# Patient Record
Sex: Female | Born: 1945 | Race: White | Hispanic: No | Marital: Single | Smoking: Never smoker
Health system: Southern US, Community
[De-identification: ages and names within clinical notes are randomized; demographics above are authoritative.]

## PROBLEM LIST (undated history)

## (undated) DIAGNOSIS — M797 Fibromyalgia: Secondary | ICD-10-CM

## (undated) DIAGNOSIS — R011 Cardiac murmur, unspecified: Secondary | ICD-10-CM

## (undated) DIAGNOSIS — E785 Hyperlipidemia, unspecified: Secondary | ICD-10-CM

## (undated) DIAGNOSIS — M1990 Unspecified osteoarthritis, unspecified site: Secondary | ICD-10-CM

## (undated) DIAGNOSIS — J40 Bronchitis, not specified as acute or chronic: Secondary | ICD-10-CM

## (undated) DIAGNOSIS — I10 Essential (primary) hypertension: Secondary | ICD-10-CM

## (undated) HISTORY — PX: JOINT REPLACEMENT: SHX530

## (undated) HISTORY — PX: APPENDECTOMY: SHX54

## (undated) HISTORY — DX: Fibromyalgia: M79.7

## (undated) HISTORY — DX: Hyperlipidemia, unspecified: E78.5

## (undated) HISTORY — DX: Essential (primary) hypertension: I10

## (undated) HISTORY — PX: REPLACEMENT TOTAL KNEE BILATERAL: SUR1225

## (undated) HISTORY — PX: BACK SURGERY: SHX140

---

## 1997-08-26 HISTORY — PX: CHOLECYSTECTOMY: SHX55

## 2002-08-26 HISTORY — PX: LUMBAR FUSION: SHX111

## 2003-08-27 HISTORY — PX: X-STOP IMPLANTATION: SHX2677

## 2005-08-26 HISTORY — PX: TOTAL HIP ARTHROPLASTY: SHX124

## 2010-10-17 ENCOUNTER — Other Ambulatory Visit (HOSPITAL_COMMUNITY): Payer: Self-pay | Admitting: Orthopedic Surgery

## 2010-10-17 DIAGNOSIS — M25552 Pain in left hip: Secondary | ICD-10-CM

## 2010-10-25 ENCOUNTER — Ambulatory Visit (HOSPITAL_COMMUNITY)
Admission: RE | Admit: 2010-10-25 | Discharge: 2010-10-25 | Disposition: A | Discharge status: Home / Self Care | Payer: Managed Care, Other (non HMO) | Source: Ambulatory Visit | Attending: Orthopedic Surgery | Admitting: Orthopedic Surgery

## 2010-10-25 ENCOUNTER — Encounter (HOSPITAL_COMMUNITY)
Admission: RE | Admit: 2010-10-25 | Discharge: 2010-10-25 | Disposition: A | Discharge status: Home / Self Care | Payer: Managed Care, Other (non HMO) | Source: Ambulatory Visit | Attending: Orthopedic Surgery | Admitting: Orthopedic Surgery

## 2010-10-25 ENCOUNTER — Encounter (HOSPITAL_COMMUNITY): Payer: Self-pay

## 2010-10-25 DIAGNOSIS — M25559 Pain in unspecified hip: Secondary | ICD-10-CM | POA: Insufficient documentation

## 2010-10-25 DIAGNOSIS — M79609 Pain in unspecified limb: Secondary | ICD-10-CM | POA: Insufficient documentation

## 2010-10-25 DIAGNOSIS — Z96649 Presence of unspecified artificial hip joint: Secondary | ICD-10-CM | POA: Insufficient documentation

## 2010-10-25 DIAGNOSIS — M25552 Pain in left hip: Secondary | ICD-10-CM

## 2010-10-25 MED ORDER — TECHNETIUM TC 99M MEDRONATE IV KIT
24.0000 | PACK | Freq: Once | INTRAVENOUS | Status: AC | PRN
  Administered 2010-10-25: 24 via INTRAVENOUS

## 2011-08-05 ENCOUNTER — Other Ambulatory Visit: Payer: Self-pay | Admitting: Orthopedic Surgery

## 2011-08-05 DIAGNOSIS — M25512 Pain in left shoulder: Secondary | ICD-10-CM

## 2011-08-08 ENCOUNTER — Other Ambulatory Visit: Payer: Self-pay | Admitting: Orthopedic Surgery

## 2011-08-08 ENCOUNTER — Ambulatory Visit
Admission: RE | Admit: 2011-08-08 | Discharge: 2011-08-08 | Disposition: A | Discharge status: Home / Self Care | Payer: Commercial Indemnity | Source: Ambulatory Visit | Attending: Orthopedic Surgery | Admitting: Orthopedic Surgery

## 2011-08-08 DIAGNOSIS — T148XXA Other injury of unspecified body region, initial encounter: Secondary | ICD-10-CM

## 2013-06-03 ENCOUNTER — Other Ambulatory Visit: Payer: Self-pay | Admitting: Nurse Practitioner

## 2013-06-03 DIAGNOSIS — M316 Other giant cell arteritis: Secondary | ICD-10-CM

## 2013-07-15 ENCOUNTER — Encounter: Payer: Self-pay | Admitting: Cardiovascular Disease

## 2013-07-15 ENCOUNTER — Encounter (HOSPITAL_COMMUNITY): Payer: Self-pay | Admitting: *Deleted

## 2013-07-15 ENCOUNTER — Ambulatory Visit (INDEPENDENT_AMBULATORY_CARE_PROVIDER_SITE_OTHER): Payer: Managed Care, Other (non HMO) | Admitting: Cardiovascular Disease

## 2013-07-15 VITALS — BP 140/92 | HR 65 | Ht 62.5 in | Wt 199.8 lb

## 2013-07-15 DIAGNOSIS — Z01818 Encounter for other preprocedural examination: Secondary | ICD-10-CM | POA: Insufficient documentation

## 2013-07-15 DIAGNOSIS — E785 Hyperlipidemia, unspecified: Secondary | ICD-10-CM

## 2013-07-15 DIAGNOSIS — IMO0001 Reserved for inherently not codable concepts without codable children: Secondary | ICD-10-CM

## 2013-07-15 DIAGNOSIS — I10 Essential (primary) hypertension: Secondary | ICD-10-CM

## 2013-07-15 DIAGNOSIS — M797 Fibromyalgia: Secondary | ICD-10-CM | POA: Insufficient documentation

## 2013-07-15 NOTE — Patient Instructions (Signed)
  We will see you back in follow up as needed  Dr Allyson Sabal has ordered an exercise myoview

## 2013-07-15 NOTE — Assessment & Plan Note (Signed)
SBP mildly elevated but given age and no history of CVA/MI maintaining SBP <150 is reasonable. DBP mildly elevated in office but patient states regularly at 80 at home. Can continue ace-i at current doses. Will not add additional agent at this time.

## 2013-07-15 NOTE — Assessment & Plan Note (Signed)
Given hypertension, hyperlipidemia, family history of early MI, and surgeon's request, will proceed with exercise Myoview. Fortunately, patient with METs >4 and no history of MI/CVA/arrythmia, patient is low risk so hopeful for a normal Myoview. In addition, shoulder surgery is a low risk operation from cardiac perspective.  Patient does not require echocardiogram with benign flow murmur. Additionally, myoview will give [place] an ejection fraction.

## 2013-07-15 NOTE — Assessment & Plan Note (Signed)
On maximized medical therapy with Crestor 20mg  and Zetia. Patient compliant.

## 2013-07-15 NOTE — Progress Notes (Signed)
07/15/2013 Regina Rhodes   August 23, 1946  [id number]  Primary Physician Pcp Not In System. Referred by orthopedist Dr. Rennis Chris Primary Cardiologist: establishing care here today  HPI:    67 year old female with history of hypertension, hyperlipidemia, and family history of early MI presenting for preoperative evaluation before non-cardiac surgery. Patient is to have a total left shoulder replacement. Her most recent surgery was a total hip replacement in 2007. She denies history of MI or CVA or arrythmia. She denies chest pain or shortness of breath. Denies orthopnea or PND. She is able to walk up a flight of stairs or up a steep hill without difficulty. She does water aerobics 3x a week. She does have a history of a heart murmur since childhood which she states has been unchanged. Patient checks her blood pressure at home and it is typically around 140 SBP and 80 DBP.   Current Outpatient Prescriptions  Medication Sig Dispense Refill  . DULoxetine (CYMBALTA) 60 MG capsule Take 60 mg by mouth daily.      Marland Kitchen ezetimibe (ZETIA) 10 MG tablet Take 10 mg by mouth daily.      . fosinopril (MONOPRIL) 40 MG tablet Take 40 mg by mouth daily.      . Naproxen Sodium (ALEVE PO) Take 1 tablet by mouth daily.      . rosuvastatin (CRESTOR) 20 MG tablet Take 20 mg by mouth daily.       No current facility-administered medications for this visit.    No Known Allergies  History   Social History  . Marital Status: Widowed    Spouse Name: N/A    Number of Children: N/A  . Years of Education: N/A    Social History Main Topics  . Smoking status: Never Smoker   . Smokeless tobacco: Not on file  . Alcohol Use: 8.4 oz/week    14 Glasses of wine per week  . Drug Use: No  . 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 In [place] half a year, and [place] half a year. Down here to spend time with family.      Review of Systems: General: negative for  chills, fever, night sweats or weight changes.  Cardiovascular: negative for chest pain, dyspnea on exertion, edema, orthopnea, palpitations, paroxysmal nocturnal dyspnea or shortness of breath Dermatological: negative for rash Respiratory: negative for cough or wheezing Urologic: negative for hematuria Abdominal: negative for nausea, vomiting, diarrhea, bright red blood per rectum, melena, or hematemesis Neurologic: negative for visual changes, syncope, or dizziness All other systems reviewed and are otherwise negative except as noted above.   Blood pressure 140/92, pulse 65, height 5' 2.5" (1.588 m), weight 199 lb 12.8 oz (90.629 kg).  General appearance: alert and cooperative Neck: no carotid bruit, no JVD and supple, symmetrical, trachea midline Lungs: clear to auscultation bilaterally Heart: regular rate and rhythm. 1/6 SEM at RUSB. crescendo-decrescendo early peaking. Slight increase with lying flat.  Abdomen: soft, non-tender; bowel sounds normal; no masses,  no organomegaly Extremities: extremities normal, atraumatic, no cyanosis or edema Pulses: 2+ and symmetric Skin: Skin color, texture, turgor normal. No rashes or lesions Neurologic: Grossly normal  EKG HR 65, normal sinus rhythm, no hypertrophy, normal intervals, no st-t wave changes  ASSESSMENT AND PLAN:   Hyperlipemia On maximized medical therapy with Crestor 20mg  and Zetia. Patient compliant.   Hypertension SBP mildly elevated but given age and no history of CVA/MI maintaining  SBP <150 is reasonable. DBP mildly elevated in office but patient states regularly at 80 at home. Can continue ace-i at current doses. Will not add additional agent at this time.   Preoperative clearance Given hypertension, hyperlipidemia, family history of early MI, and surgeon's request, will proceed with exercise Myoview. Fortunately, patient with METs >4 and no history of MI/CVA/arrythmia, patient is low risk so hopeful for a normal Myoview.  In addition, shoulder surgery is a low risk operation from cardiac perspective.  Patient does not require echocardiogram with benign flow murmur. Additionally, myoview will give [place] an ejection fraction.    Dr. Allyson Sabal has seen and evaluated the patient. We have discussed the history, exam, assessment, and plan as noted above. He agrees with management.   Aldine Contes. Marti Sleigh, MD, PGY3 [facility] 07/15/2013 3:08 PM   Runell Gess, M.D., [facility] [address] [place], [place]  [address]  [phone] 07/15/2013 3:26 PM  07/15/2013 2:50 PM

## 2013-07-16 ENCOUNTER — Encounter: Payer: Self-pay | Admitting: Cardiovascular Disease

## 2013-07-27 ENCOUNTER — Encounter: Payer: Self-pay | Admitting: *Deleted

## 2013-07-27 ENCOUNTER — Ambulatory Visit (HOSPITAL_COMMUNITY)
Admission: RE | Admit: 2013-07-27 | Discharge: 2013-07-27 | Disposition: A | Discharge status: Home / Self Care | Payer: Managed Care, Other (non HMO) | Source: Ambulatory Visit | Attending: Internal Medicine | Admitting: Internal Medicine

## 2013-07-27 DIAGNOSIS — Z01818 Encounter for other preprocedural examination: Secondary | ICD-10-CM

## 2013-07-27 DIAGNOSIS — E669 Obesity, unspecified: Secondary | ICD-10-CM | POA: Insufficient documentation

## 2013-07-27 DIAGNOSIS — Z01810 Encounter for preprocedural cardiovascular examination: Secondary | ICD-10-CM | POA: Insufficient documentation

## 2013-07-27 DIAGNOSIS — I10 Essential (primary) hypertension: Secondary | ICD-10-CM | POA: Insufficient documentation

## 2013-07-27 DIAGNOSIS — Z8249 Family history of ischemic heart disease and other diseases of the circulatory system: Secondary | ICD-10-CM | POA: Insufficient documentation

## 2013-07-27 MED ORDER — REGADENOSON 0.4 MG/5ML IV SOLN
0.4000 mg | Freq: Once | INTRAVENOUS | Status: AC
  Administered 2013-07-27: 0.4 mg via INTRAVENOUS

## 2013-07-27 MED ORDER — TECHNETIUM TC 99M SESTAMIBI GENERIC - CARDIOLITE
30.6000 | Freq: Once | INTRAVENOUS | Status: AC | PRN
  Administered 2013-07-27: 31 via INTRAVENOUS

## 2013-07-27 MED ORDER — TECHNETIUM TC 99M SESTAMIBI GENERIC - CARDIOLITE
10.1000 | Freq: Once | INTRAVENOUS | Status: AC | PRN
  Administered 2013-07-27: 10.1 via INTRAVENOUS

## 2013-07-27 NOTE — Procedures (Addendum)
[facility] [address] [place] [address] [phone]  Cardiology Nuclear Med Study  Regina Rhodes is a 67 y.o. female     MRN : [id number]     DOB: May 14, 1946  Procedure Date: 07/27/2013  Nuclear Med Background Indication for Stress Test:  Surgical Clearance History:  No prior cardiac history Cardiac Risk Factors: Family History - CAD, Hypertension, Lipids and Obesity  Symptoms:  None   Nuclear Pre-Procedure Caffeine/Decaff Intake:  7:00pm NPO After: 5:00am   IV Site: R Antecubital  IV 0.9% NS with Angio Cath:  22g  Chest Size (in):  n/a IV Started by: Koren Shiver, CNMT  Height:  5'2"  Cup Size: 38C  BMI:  There is no weight on file to calculate BMI. Weight:   199 lb   Tech Comments:  Test was changed to a Lexiscan due to patient not being able to achieve her target heart due to knee pain. She requested to stop treadmill.     Nuclear Med Study 1 or 2 day study: 1 day  Stress Test Type:  Lexiscan  Order Authorizing Provider:  Obie Dredge   Resting Radionuclide: Technetium 23m Sestamibi  Resting Radionuclide Dose: 10.1 mCi   Stress Radionuclide:  Technetium 67m Sestamibi  Stress Radionuclide Dose: 30.6 mCi           Stress Protocol Rest HR:85 Stress HR: 89  Rest BP: 218/74 Stress BP:218/74  Exercise Time (min): n/a METS: n/a          Dose of Adenosine (mg):  n/a Dose of Lexiscan: 0.4 mg  Dose of Atropine (mg): n/a Dose of Dobutamine: n/a mcg/kg/min (at max HR)  Stress Test Technologist: Ernestene Mention, CCT Nuclear Technologist: Gonzella Lex, CNMT   Rest Procedure:  Myocardial perfusion imaging was performed at rest 45 minutes following the intravenous administration of Technetium 54m Sestamibi. Stress Procedure:  The patient received IV Lexiscan 0.4 mg over 15-seconds.  Technetium 9m Sestamibi injected at 30-seconds.  There were no significant changes with Lexiscan.  Quantitative spect  images were obtained after a 45 minute delay.  Transient Ischemic Dilatation (Normal <1.22):  1.0 Lung/Heart Ratio (Normal <0.45):  0.22 QGS EDV:  78 ml QGS ESV:  28 ml LV Ejection Fraction: 64%  Signed by Velora Heckler, Rad Tech on 07/27/2013 at 9:15 AM.  Rest ECG: NSR - Normal EKG  Stress ECG: No significant change from baseline ECG  QPS Raw Data Images:  Mild breast attenuation.  Normal left ventricular size. Stress Images:  There is decreased uptake in the anterior wall. Rest Images:  There is decreased uptake in the anterior wall. Subtraction (SDS):  There is a fixed anteriour defect that is most consistent with breast attenuation.  Impression Exercise Capacity:  Poor exercise capacity. BP Response:  Normal blood pressure response. Clinical Symptoms:  none- exercise was "too hard" ECG Impression:  No significant ECG changes with Lexiscan. Comparison with Prior Nuclear Study: No previous nuclear study performed  Overall Impression:  Low risk stress nuclear study small area of breast attenuation artifact. No ischemia.  LV Wall Motion:  NL LV Function; NL Wall Motion; EF 64%.  Chrystie Nose, MD, [facility] Board Certified in Nuclear Cardiology Attending Cardiologist [facility]   Chrystie Nose, MD  07/27/2013 12:38 PM

## 2013-07-28 ENCOUNTER — Inpatient Hospital Stay (HOSPITAL_COMMUNITY): Admission: RE | Admit: 2013-07-28 | Payer: Commercial Indemnity | Source: Ambulatory Visit

## 2013-08-02 ENCOUNTER — Encounter (HOSPITAL_COMMUNITY): Payer: Self-pay | Admitting: Pharmacy Technician

## 2013-08-03 NOTE — Pre-Procedure Instructions (Signed)
Regina Rhodes  08/03/2013   Your procedure is scheduled on: Thursday, Dec. 11th     Report to [facility]  2 * 3 at 5:30 AM.  Call this number if you have problems the morning of surgery: [phone]   Remember:   Do not eat food or drink liquids after midnight Wednesday.   Take these medicines the morning of surgery with A SIP OF WATER: Cymbalta   Do not wear jewelry, make-up or nail polish.  Do not wear lotions, powders, or perfumes. You may NOT wear deodorant.  Do not shave underarms & legs 48 hours prior to surgery.    Do not bring valuables to the hospital.  [facility] is not responsible for any belongings or valuables.               Contacts, dentures or bridgework may not be worn into surgery.  Leave suitcase in the car. After surgery it may be brought to your room.  For patients admitted to the hospital, discharge time is determined by your treatment team.              Name and phone number of your driver:    Special Instructions: Shower using CHG 2 nights before surgery and the night before surgery.  If you shower the day of surgery use CHG.  Use special wash - you have one bottle of CHG for all showers.  You should use approximately 1/3 of the bottle for each shower.   Please read over the following fact sheets that you were given: Pain Booklet, MRSA Information and Surgical Site Infection Prevention

## 2013-08-04 ENCOUNTER — Encounter (HOSPITAL_COMMUNITY)
Admission: RE | Admit: 2013-08-04 | Discharge: 2013-08-04 | Disposition: A | Discharge status: Home / Self Care | Payer: Commercial Indemnity | Source: Ambulatory Visit | Attending: Anesthesiology | Admitting: Anesthesiology

## 2013-08-04 ENCOUNTER — Encounter (HOSPITAL_COMMUNITY)
Admission: RE | Admit: 2013-08-04 | Discharge: 2013-08-04 | Disposition: A | Discharge status: Home / Self Care | Payer: Commercial Indemnity | Source: Ambulatory Visit | Attending: Orthopedic Surgery | Admitting: Orthopedic Surgery

## 2013-08-04 ENCOUNTER — Encounter (HOSPITAL_COMMUNITY): Payer: Self-pay

## 2013-08-04 HISTORY — DX: Cardiac murmur, unspecified: R01.1

## 2013-08-04 LAB — BASIC METABOLIC PANEL
CO2: 28 mEq/L (ref 19–32)
Chloride: 102 mEq/L (ref 96–112)
Creatinine, Ser: 0.76 mg/dL (ref 0.50–1.10)

## 2013-08-04 LAB — CBC
HCT: 42.3 % (ref 36.0–46.0)
MCH: 33.2 pg (ref 26.0–34.0)
MCV: 100.2 fL — ABNORMAL HIGH (ref 78.0–100.0)
RDW: 12.8 % (ref 11.5–15.5)
WBC: 6.4 10*3/uL (ref 4.0–10.5)

## 2013-08-04 LAB — ABO/RH: ABO/RH(D): AB POS

## 2013-08-04 LAB — TYPE AND SCREEN

## 2013-08-04 MED ORDER — DEXTROSE 5 % IV SOLN
3.0000 g | INTRAVENOUS | Status: AC
  Administered 2013-08-05: 3 g via INTRAVENOUS
  Filled 2013-08-04: qty 3000

## 2013-08-04 MED ORDER — CHLORHEXIDINE GLUCONATE 4 % EX LIQD: 60.0000 mL | Freq: Once | CUTANEOUS | Status: DC

## 2013-08-05 ENCOUNTER — Ambulatory Visit (HOSPITAL_COMMUNITY): Payer: Commercial Indemnity | Admitting: Critical Care Medicine

## 2013-08-05 ENCOUNTER — Encounter (HOSPITAL_COMMUNITY): Payer: Commercial Indemnity | Admitting: Vascular Surgery

## 2013-08-05 ENCOUNTER — Encounter (HOSPITAL_COMMUNITY): Payer: Self-pay | Admitting: *Deleted

## 2013-08-05 ENCOUNTER — Encounter (HOSPITAL_COMMUNITY)
Admission: RE | Disposition: A | Discharge status: Home / Self Care | Payer: Self-pay | Source: Ambulatory Visit | Attending: Orthopedic Surgery

## 2013-08-05 ENCOUNTER — Inpatient Hospital Stay (HOSPITAL_COMMUNITY)
Admission: RE | Admit: 2013-08-05 | Discharge: 2013-08-06 | DRG: 483 | Disposition: A | Discharge status: Home / Self Care | Payer: Commercial Indemnity | Source: Ambulatory Visit | Attending: Orthopedic Surgery | Admitting: Orthopedic Surgery

## 2013-08-05 DIAGNOSIS — Z9089 Acquired absence of other organs: Secondary | ICD-10-CM

## 2013-08-05 DIAGNOSIS — Z01812 Encounter for preprocedural laboratory examination: Secondary | ICD-10-CM

## 2013-08-05 DIAGNOSIS — Z8249 Family history of ischemic heart disease and other diseases of the circulatory system: Secondary | ICD-10-CM

## 2013-08-05 DIAGNOSIS — E785 Hyperlipidemia, unspecified: Secondary | ICD-10-CM | POA: Diagnosis present

## 2013-08-05 DIAGNOSIS — IMO0001 Reserved for inherently not codable concepts without codable children: Secondary | ICD-10-CM | POA: Diagnosis present

## 2013-08-05 DIAGNOSIS — Z96649 Presence of unspecified artificial hip joint: Secondary | ICD-10-CM

## 2013-08-05 DIAGNOSIS — Z96659 Presence of unspecified artificial knee joint: Secondary | ICD-10-CM

## 2013-08-05 DIAGNOSIS — M19019 Primary osteoarthritis, unspecified shoulder: Principal | ICD-10-CM | POA: Diagnosis present

## 2013-08-05 DIAGNOSIS — Z981 Arthrodesis status: Secondary | ICD-10-CM

## 2013-08-05 DIAGNOSIS — Z79899 Other long term (current) drug therapy: Secondary | ICD-10-CM

## 2013-08-05 DIAGNOSIS — I10 Essential (primary) hypertension: Secondary | ICD-10-CM | POA: Diagnosis present

## 2013-08-05 DIAGNOSIS — Z96619 Presence of unspecified artificial shoulder joint: Secondary | ICD-10-CM

## 2013-08-05 DIAGNOSIS — R011 Cardiac murmur, unspecified: Secondary | ICD-10-CM | POA: Diagnosis present

## 2013-08-05 HISTORY — PX: TOTAL SHOULDER ARTHROPLASTY: SHX126

## 2013-08-05 SURGERY — ARTHROPLASTY, SHOULDER, TOTAL
Anesthesia: Regional | Site: Shoulder | Laterality: Left

## 2013-08-05 MED ORDER — ACETAMINOPHEN 650 MG RE SUPP: 650.0000 mg | Freq: Four times a day (QID) | RECTAL | Status: DC | PRN

## 2013-08-05 MED ORDER — POLYETHYLENE GLYCOL 3350 17 G PO PACK: 17.0000 g | PACK | Freq: Every day | ORAL | Status: DC | PRN

## 2013-08-05 MED ORDER — FLEET ENEMA 7-19 GM/118ML RE ENEM: 1.0000 | ENEMA | Freq: Once | RECTAL | Status: AC | PRN

## 2013-08-05 MED ORDER — HYDROMORPHONE HCL PF 1 MG/ML IJ SOLN: 0.2500 mg | INTRAMUSCULAR | Status: DC | PRN

## 2013-08-05 MED ORDER — SODIUM CHLORIDE 0.9 % IR SOLN
Status: DC | PRN
  Administered 2013-08-05: 1000 mL

## 2013-08-05 MED ORDER — PHENOL 1.4 % MT LIQD: 1.0000 | OROMUCOSAL | Status: DC | PRN

## 2013-08-05 MED ORDER — LACTATED RINGERS IV SOLN
INTRAVENOUS | Status: DC | PRN
  Administered 2013-08-05 (×2): via INTRAVENOUS

## 2013-08-05 MED ORDER — ONDANSETRON HCL 4 MG/2ML IJ SOLN: 4.0000 mg | Freq: Four times a day (QID) | INTRAMUSCULAR | Status: DC | PRN

## 2013-08-05 MED ORDER — OXYCODONE-ACETAMINOPHEN 5-325 MG PO TABS
1.0000 | ORAL_TABLET | ORAL | Status: DC | PRN
Start: 2013-08-05 — End: 2013-08-06
  Administered 2013-08-05 – 2013-08-06 (×3): 2 via ORAL
  Filled 2013-08-05 (×3): qty 2

## 2013-08-05 MED ORDER — METHOCARBAMOL 500 MG PO TABS
500.0000 mg | ORAL_TABLET | Freq: Four times a day (QID) | ORAL | Status: DC | PRN
  Administered 2013-08-05 – 2013-08-06 (×2): 500 mg via ORAL
  Filled 2013-08-05 (×2): qty 1

## 2013-08-05 MED ORDER — DIPHENHYDRAMINE HCL 12.5 MG/5ML PO ELIX: 12.5000 mg | ORAL_SOLUTION | ORAL | Status: DC | PRN

## 2013-08-05 MED ORDER — ALUM & MAG HYDROXIDE-SIMETH 200-200-20 MG/5ML PO SUSP: 30.0000 mL | ORAL | Status: DC | PRN

## 2013-08-05 MED ORDER — ONDANSETRON HCL 4 MG/2ML IJ SOLN: 4.0000 mg | Freq: Once | INTRAMUSCULAR | Status: DC | PRN

## 2013-08-05 MED ORDER — LIDOCAINE HCL 4 % MT SOLN
OROMUCOSAL | Status: DC | PRN
  Administered 2013-08-05: 5 mL via TOPICAL

## 2013-08-05 MED ORDER — GLYCOPYRROLATE 0.2 MG/ML IJ SOLN
INTRAMUSCULAR | Status: DC | PRN
  Administered 2013-08-05: 0.4 mg via INTRAVENOUS

## 2013-08-05 MED ORDER — ROCURONIUM BROMIDE 100 MG/10ML IV SOLN
INTRAVENOUS | Status: DC | PRN
  Administered 2013-08-05: 30 mg via INTRAVENOUS

## 2013-08-05 MED ORDER — METOCLOPRAMIDE HCL 10 MG PO TABS: 5.0000 mg | ORAL_TABLET | Freq: Three times a day (TID) | ORAL | Status: DC | PRN

## 2013-08-05 MED ORDER — TEMAZEPAM 15 MG PO CAPS: 15.0000 mg | ORAL_CAPSULE | Freq: Every evening | ORAL | Status: DC | PRN

## 2013-08-05 MED ORDER — EZETIMIBE 10 MG PO TABS
10.0000 mg | ORAL_TABLET | Freq: Every day | ORAL | Status: DC
  Administered 2013-08-05 – 2013-08-06 (×2): 10 mg via ORAL
  Filled 2013-08-05 (×3): qty 1

## 2013-08-05 MED ORDER — CEFAZOLIN SODIUM 1-5 GM-% IV SOLN
1.0000 g | Freq: Four times a day (QID) | INTRAVENOUS | Status: AC
  Administered 2013-08-05 (×3): 1 g via INTRAVENOUS
  Filled 2013-08-05 (×5): qty 50

## 2013-08-05 MED ORDER — METHOCARBAMOL 100 MG/ML IJ SOLN
500.0000 mg | Freq: Four times a day (QID) | INTRAVENOUS | Status: DC | PRN
  Filled 2013-08-05: qty 5

## 2013-08-05 MED ORDER — MENTHOL 3 MG MT LOZG
1.0000 | LOZENGE | OROMUCOSAL | Status: DC | PRN
  Filled 2013-08-05: qty 9

## 2013-08-05 MED ORDER — BISACODYL 5 MG PO TBEC: 5.0000 mg | DELAYED_RELEASE_TABLET | Freq: Every day | ORAL | Status: DC | PRN

## 2013-08-05 MED ORDER — FENTANYL CITRATE 0.05 MG/ML IJ SOLN
INTRAMUSCULAR | Status: DC | PRN
  Administered 2013-08-05: 25 ug via INTRAVENOUS
  Administered 2013-08-05: 75 ug via INTRAVENOUS
  Administered 2013-08-05: 50 ug via INTRAVENOUS
  Administered 2013-08-05 (×2): 25 ug via INTRAVENOUS
  Administered 2013-08-05: 50 ug via INTRAVENOUS

## 2013-08-05 MED ORDER — ZOLPIDEM TARTRATE 5 MG PO TABS: 5.0000 mg | ORAL_TABLET | Freq: Every evening | ORAL | Status: DC | PRN

## 2013-08-05 MED ORDER — PHENYLEPHRINE HCL 10 MG/ML IJ SOLN
10.0000 mg | INTRAVENOUS | Status: DC | PRN
  Administered 2013-08-05: 25 ug/min via INTRAVENOUS

## 2013-08-05 MED ORDER — ACETAMINOPHEN 325 MG PO TABS: 650.0000 mg | ORAL_TABLET | Freq: Four times a day (QID) | ORAL | Status: DC | PRN

## 2013-08-05 MED ORDER — PROPOFOL 10 MG/ML IV BOLUS
INTRAVENOUS | Status: DC | PRN
  Administered 2013-08-05: 160 mg via INTRAVENOUS

## 2013-08-05 MED ORDER — KETOROLAC TROMETHAMINE 15 MG/ML IJ SOLN
15.0000 mg | Freq: Four times a day (QID) | INTRAMUSCULAR | Status: DC
  Administered 2013-08-05 – 2013-08-06 (×4): 15 mg via INTRAVENOUS
  Filled 2013-08-05 (×8): qty 1

## 2013-08-05 MED ORDER — DULOXETINE HCL 60 MG PO CPEP
60.0000 mg | ORAL_CAPSULE | Freq: Every day | ORAL | Status: DC
  Administered 2013-08-06: 60 mg via ORAL
  Filled 2013-08-05: qty 1

## 2013-08-05 MED ORDER — FOSINOPRIL SODIUM 20 MG PO TABS
40.0000 mg | ORAL_TABLET | Freq: Every day | ORAL | Status: DC
  Filled 2013-08-05: qty 2

## 2013-08-05 MED ORDER — LIDOCAINE HCL (CARDIAC) 20 MG/ML IV SOLN
INTRAVENOUS | Status: DC | PRN
  Administered 2013-08-05: 80 mg via INTRAVENOUS

## 2013-08-05 MED ORDER — DOCUSATE SODIUM 100 MG PO CAPS
100.0000 mg | ORAL_CAPSULE | Freq: Two times a day (BID) | ORAL | Status: DC
  Administered 2013-08-05 – 2013-08-06 (×3): 100 mg via ORAL
  Filled 2013-08-05 (×3): qty 1

## 2013-08-05 MED ORDER — ATORVASTATIN CALCIUM 40 MG PO TABS
40.0000 mg | ORAL_TABLET | Freq: Every day | ORAL | Status: DC
  Administered 2013-08-05: 40 mg via ORAL
  Filled 2013-08-05 (×2): qty 1

## 2013-08-05 MED ORDER — LACTATED RINGERS IV SOLN: INTRAVENOUS | Status: DC

## 2013-08-05 MED ORDER — NEOSTIGMINE METHYLSULFATE 1 MG/ML IJ SOLN
INTRAMUSCULAR | Status: DC | PRN
  Administered 2013-08-05: 3 mg via INTRAVENOUS

## 2013-08-05 MED ORDER — METOCLOPRAMIDE HCL 5 MG/ML IJ SOLN: 5.0000 mg | Freq: Three times a day (TID) | INTRAMUSCULAR | Status: DC | PRN

## 2013-08-05 MED ORDER — ARTIFICIAL TEARS OP OINT
TOPICAL_OINTMENT | OPHTHALMIC | Status: DC | PRN
  Administered 2013-08-05: 1 via OPHTHALMIC

## 2013-08-05 MED ORDER — ONDANSETRON HCL 4 MG/2ML IJ SOLN
INTRAMUSCULAR | Status: DC | PRN
  Administered 2013-08-05: 4 mg via INTRAVENOUS

## 2013-08-05 MED ORDER — DEXAMETHASONE SODIUM PHOSPHATE 4 MG/ML IJ SOLN
INTRAMUSCULAR | Status: DC | PRN
  Administered 2013-08-05: 4 mg via INTRAVENOUS

## 2013-08-05 MED ORDER — FOSINOPRIL SODIUM 20 MG PO TABS
40.0000 mg | ORAL_TABLET | Freq: Every day | ORAL | Status: DC
  Administered 2013-08-05 – 2013-08-06 (×2): 40 mg via ORAL
  Filled 2013-08-05 (×2): qty 2

## 2013-08-05 MED ORDER — ONDANSETRON HCL 4 MG PO TABS: 4.0000 mg | ORAL_TABLET | Freq: Four times a day (QID) | ORAL | Status: DC | PRN

## 2013-08-05 SURGICAL SUPPLY — 62 items
BLADE SAW SGTL 83.5X18.5 (BLADE) ×2 IMPLANT
CEMENT BONE DEPUY (Cement) ×2 IMPLANT
CLOTH BEACON ORANGE TIMEOUT ST (SAFETY) ×2 IMPLANT
CLSR STERI-STRIP ANTIMIC 1/2X4 (GAUZE/BANDAGES/DRESSINGS) ×2 IMPLANT
COVER SURGICAL LIGHT HANDLE (MISCELLANEOUS) ×2 IMPLANT
DRAPE INCISE IOBAN 66X45 STRL (DRAPES) ×2 IMPLANT
DRAPE SURG 17X11 SM STRL (DRAPES) ×2 IMPLANT
DRAPE SURG 17X23 STRL (DRAPES) ×2 IMPLANT
DRAPE U-SHAPE 47X51 STRL (DRAPES) ×2 IMPLANT
DRESSING AQUACEL AQ EXTRA 4X5 (GAUZE/BANDAGES/DRESSINGS) ×2 IMPLANT
DRILL BIT 7/64X5 (BIT) IMPLANT
DRSG AQUACEL AG ADV 3.5X10 (GAUZE/BANDAGES/DRESSINGS) ×2 IMPLANT
DRSG MEPILEX BORDER 4X8 (GAUZE/BANDAGES/DRESSINGS) ×2 IMPLANT
DURAPREP 26ML APPLICATOR (WOUND CARE) ×4 IMPLANT
ELECT BLADE 4.0 EZ CLEAN MEGAD (MISCELLANEOUS) ×2
ELECT CAUTERY BLADE 6.4 (BLADE) ×2 IMPLANT
ELECT REM PT RETURN 9FT ADLT (ELECTROSURGICAL) ×2
ELECTRODE BLDE 4.0 EZ CLN MEGD (MISCELLANEOUS) ×1 IMPLANT
ELECTRODE REM PT RTRN 9FT ADLT (ELECTROSURGICAL) ×1 IMPLANT
FACESHIELD LNG OPTICON STERILE (SAFETY) ×6 IMPLANT
GLENOID ANCHOR PEG CROSSLK 44 (Orthopedic Implant) ×2 IMPLANT
GLOVE BIO SURGEON STRL SZ7.5 (GLOVE) ×2 IMPLANT
GLOVE BIO SURGEON STRL SZ8 (GLOVE) ×2 IMPLANT
GLOVE EUDERMIC 7 POWDERFREE (GLOVE) ×2 IMPLANT
GLOVE SS BIOGEL STRL SZ 7.5 (GLOVE) ×1 IMPLANT
GLOVE SUPERSENSE BIOGEL SZ 7.5 (GLOVE) ×1
GOWN STRL NON-REIN LRG LVL3 (GOWN DISPOSABLE) ×2 IMPLANT
GOWN STRL REIN XL XLG (GOWN DISPOSABLE) ×4 IMPLANT
HEAD HUM ECCENTRIC 44X18 STRL (Trauma) ×2 IMPLANT
HUMERAL STEM 10MM (Trauma) ×2 IMPLANT
KIT BASIN OR (CUSTOM PROCEDURE TRAY) ×2 IMPLANT
KIT ROOM TURNOVER OR (KITS) ×2 IMPLANT
MANIFOLD NEPTUNE II (INSTRUMENTS) ×2 IMPLANT
NDL SUT 6 .5 CRC .975X.05 MAYO (NEEDLE) ×1 IMPLANT
NEEDLE HYPO 25GX1X1/2 BEV (NEEDLE) IMPLANT
NEEDLE MAYO TAPER (NEEDLE) ×1
NS IRRIG 1000ML POUR BTL (IV SOLUTION) ×2 IMPLANT
PACK SHOULDER (CUSTOM PROCEDURE TRAY) ×2 IMPLANT
PAD ARMBOARD 7.5X6 YLW CONV (MISCELLANEOUS) ×4 IMPLANT
PASSER SUT SWANSON 36MM LOOP (INSTRUMENTS) IMPLANT
PIN METAGLENE 2.5 (PIN) ×4 IMPLANT
SLING ARM FOAM STRAP LRG (SOFTGOODS) ×2 IMPLANT
SLING ARM FOAM STRAP XLG (SOFTGOODS) ×2 IMPLANT
SMARTMIX MINI TOWER (MISCELLANEOUS) ×2
SPONGE LAP 18X18 X RAY DECT (DISPOSABLE) ×2 IMPLANT
SPONGE LAP 4X18 X RAY DECT (DISPOSABLE) ×2 IMPLANT
STEM HUMERAL 10MM (Trauma) ×1 IMPLANT
STRIP CLOSURE SKIN 1/2X4 (GAUZE/BANDAGES/DRESSINGS) ×2 IMPLANT
SUCTION FRAZIER TIP 10 FR DISP (SUCTIONS) ×2 IMPLANT
SUT BONE WAX W31G (SUTURE) IMPLANT
SUT FIBERWIRE #2 38 T-5 BLUE (SUTURE) ×4
SUT MNCRL AB 3-0 PS2 18 (SUTURE) ×2 IMPLANT
SUT VIC AB 1 CT1 27 (SUTURE) ×3
SUT VIC AB 1 CT1 27XBRD ANBCTR (SUTURE) ×3 IMPLANT
SUT VIC AB 2-0 CT1 27 (SUTURE) ×2
SUT VIC AB 2-0 CT1 TAPERPNT 27 (SUTURE) ×2 IMPLANT
SUTURE FIBERWR #2 38 T-5 BLUE (SUTURE) ×2 IMPLANT
SYR CONTROL 10ML LL (SYRINGE) IMPLANT
TOWEL OR 17X24 6PK STRL BLUE (TOWEL DISPOSABLE) ×2 IMPLANT
TOWEL OR 17X26 10 PK STRL BLUE (TOWEL DISPOSABLE) ×2 IMPLANT
TOWER SMARTMIX MINI (MISCELLANEOUS) ×1 IMPLANT
WATER STERILE IRR 1000ML POUR (IV SOLUTION) ×2 IMPLANT

## 2013-08-05 NOTE — Anesthesia Procedure Notes (Addendum)
Procedure Name: Intubation Date/Time: 08/05/2013 7:35 AM Performed by: Elon Alas Pre-anesthesia Checklist: Patient identified, Timeout performed, Emergency Drugs available, Suction available and Patient being monitored Patient Re-evaluated:Patient Re-evaluated prior to inductionOxygen Delivery Method: Circle system utilized Preoxygenation: Pre-oxygenation with 100% oxygen Intubation Type: IV induction Ventilation: Mask ventilation without difficulty Laryngoscope Size: Mac and 3 Grade View: Grade I Tube type: Oral Tube size: 7.0 mm Number of attempts: 1 Airway Equipment and Method: Stylet Placement Confirmation: positive ETCO2,  ETT inserted through vocal cords under direct vision and breath sounds checked- equal and bilateral Secured at: 21 cm Tube secured with: Tape Dental Injury: Teeth and Oropharynx as per pre-operative assessment    Anesthesia Regional Block:  Interscalene brachial plexus block  Pre-Anesthetic Checklist: ,, timeout performed, Correct Patient, Correct Site, Correct Laterality, Correct Procedure, Correct Position, site marked, Risks and benefits discussed,  Surgical consent,  Pre-op evaluation,  At surgeon's request and post-op pain management  Laterality: Left  Prep: chloraprep and alcohol swabs       Needles:  Injection technique: Single-shot      Additional Needles:  Procedures: nerve stimulator Interscalene brachial plexus block  Nerve Stimulator or Paresthesia:  Response: 0.5 mA, 0.1 ms, 4 cm  Additional Responses:   Narrative:  Start time: 08/05/2013 7:05 AM End time: 08/05/2013 7:10 AM Injection made incrementally with aspirations every 5 mL.  Performed by: Personally  Anesthesiologist: Maren Beach MD  Additional Notes: Pt accepts procedure and risks. 16cc 0.5% Marcaine w/ epi w/o difficulty or discomfort. GES

## 2013-08-05 NOTE — Transfer of Care (Signed)
Immediate Anesthesia Transfer of Care Note  Patient: Regina Rhodes  Procedure(s) Performed: Procedure(s): LEFT TOTAL SHOULDER ARTHROPLASTY (Left)  Patient Location: PACU  Anesthesia Type:GA combined with regional for post-op pain  Level of Consciousness: awake, alert  and oriented  Airway & Oxygen Therapy: Patient Spontanous Breathing and Patient connected to nasal cannula oxygen  Post-op Assessment: Report given to PACU RN, Post -op Vital signs reviewed and stable and Patient moving all extremities X 4  Post vital signs: Reviewed and stable  Complications: No apparent anesthesia complications

## 2013-08-05 NOTE — Progress Notes (Signed)
Report given to rebecca slade rn as caregiver 

## 2013-08-05 NOTE — Op Note (Signed)
08/05/2013  9:41 AM  PATIENT:   Regina Rhodes  66 y.o. female  PRE-OPERATIVE DIAGNOSIS:  osteoarthritis of the left shoulder  POST-OPERATIVE DIAGNOSIS:  same  PROCEDURE:  Left TSA #10 stem, 44X18 eccentric head, 44 glenoid  SURGEON:  Cristin Szatkowski, Vania Rea M.D.  ASSISTANTS: Shuford pac   ANESTHESIA:   GET + ISB  EBL: 200  SPECIMEN:  none  Drains: none   PATIENT DISPOSITION:  PACU - hemodynamically stable.    PLAN OF CARE: Admit to inpatient   Dictation# [id number]

## 2013-08-05 NOTE — Preoperative (Signed)
Beta Blockers   Reason not to administer Beta Blockers:Not Applicable 

## 2013-08-05 NOTE — Progress Notes (Signed)
Utilization review completed.  

## 2013-08-05 NOTE — H&P (Signed)
Argie Ramming    Chief Complaint: osteoarthritis of the left shoulder HPI: The patient is a 67 y.o. female with endstage left shoulder OA  Past Medical History  Diagnosis Date  . Hypertension   . Hyperlipemia   . Fibromyalgia   . Heart murmur     Past Surgical History  Procedure Laterality Date  . Replacement total knee bilateral Bilateral 1990, 2004  . Total hip arthroplasty Left 2007  . Cholecystectomy  1999  . Lumbar fusion  2004    L4 L5  . X-stop implantation  2005  . Joint replacement Bilateral     knee  . Back surgery      for stenosis  . Appendectomy      Family History  Problem Relation Age of Onset  . Heart attack Father 44    first at age 9  . Hypertension Father   . Hypertension Mother   . Hypertension Brother   . Hyperlipidemia Brother     Social History:  reports that she has never smoked. She does not have any smokeless tobacco history on file. She reports that she drinks about 8.4 ounces of alcohol per week. She reports that she does not use illicit drugs.  Allergies: No Known Allergies  Medications Prior to Admission  Medication Sig Dispense Refill  . acetaminophen (TYLENOL) 500 MG tablet Take 1,000 mg by mouth every 6 (six) hours as needed.      . diclofenac sodium (VOLTAREN) 1 % GEL Apply 2 g topically 3 (three) times daily as needed (for shoulder pain).      Marland Kitchen diphenhydramine-acetaminophen (TYLENOL PM) 25-500 MG TABS Take 1 tablet by mouth at bedtime as needed (for pain).      . DULoxetine (CYMBALTA) 60 MG capsule Take 60 mg by mouth daily.      Marland Kitchen ezetimibe (ZETIA) 10 MG tablet Take 10 mg by mouth daily.      . fosinopril (MONOPRIL) 40 MG tablet Take 40 mg by mouth daily.      . Naproxen Sodium (ALEVE PO) Take 1 tablet by mouth daily.      . rosuvastatin (CRESTOR) 20 MG tablet Take 20 mg by mouth daily.         Physical Exam: left shoulder with painful and restricted motion as noted at recent office visits  Vitals  Temp:  [97.9 F  (36.6 C)-98.1 F (36.7 C)] 98.1 F (36.7 C) (12/11 0558) Pulse Rate:  [70-76] 70 (12/11 0558) Resp:  [18-20] 18 (12/11 0558) BP: (178-214)/(70-74) 214/70 mmHg (12/11 0601) SpO2:  [96 %-100 %] 100 % (12/11 0558) Weight:  [89.9 kg (198 lb 3.1 oz)] 89.9 kg (198 lb 3.1 oz) (12/10 1307)  Assessment/Plan  Impression: osteoarthritis of the left shoulder  Plan of Action: Procedure(s): LEFT TOTAL SHOULDER ARTHROPLASTY  Makynlee Kressin M 08/05/2013, 7:24 AM

## 2013-08-05 NOTE — Anesthesia Preprocedure Evaluation (Signed)
Anesthesia Evaluation  Patient identified by MRN, date of birth, ID band Patient awake    Reviewed: Allergy & Precautions, H&P , NPO status , Patient's Chart, lab work & pertinent test results  Airway Mallampati: I TM Distance: >3 FB Neck ROM: Full    Dental  (+) Dental Advisory Given and Teeth Intact   Pulmonary          Cardiovascular hypertension, Pt. on medications     Neuro/Psych  Neuromuscular disease    GI/Hepatic   Endo/Other  Morbid obesity  Renal/GU      Musculoskeletal  (+) Fibromyalgia -  Abdominal   Peds  Hematology   Anesthesia Other Findings   Reproductive/Obstetrics                           Anesthesia Physical Anesthesia Plan  ASA: II  Anesthesia Plan: General and Regional   Post-op Pain Management:    Induction: Intravenous  Airway Management Planned: Oral ETT  Additional Equipment:   Intra-op Plan:   Post-operative Plan: Extubation in OR  Informed Consent: I have reviewed the patients History and Physical, chart, labs and discussed the procedure including the risks, benefits and alternatives for the proposed anesthesia with the patient or authorized representative who has indicated his/her understanding and acceptance.   Dental advisory given  Plan Discussed with: Anesthesiologist and Surgeon  Anesthesia Plan Comments:         Anesthesia Quick Evaluation

## 2013-08-05 NOTE — Anesthesia Postprocedure Evaluation (Signed)
  Anesthesia Post-op Note  Patient: Regina Rhodes  Procedure(s) Performed: Procedure(s): LEFT TOTAL SHOULDER ARTHROPLASTY (Left)  Patient Location: PACU  Anesthesia Type:GA combined with regional for post-op pain  Level of Consciousness: awake, alert , oriented and patient cooperative  Airway and Oxygen Therapy: Patient Spontanous Breathing  Post-op Pain: mild  Post-op Assessment: Post-op Vital signs reviewed, Patient's Cardiovascular Status Stable, Respiratory Function Stable, Patent Airway, No signs of Nausea or vomiting and Pain level controlled  Post-op Vital Signs: stable  Complications: No apparent anesthesia complications

## 2013-08-06 ENCOUNTER — Encounter (HOSPITAL_COMMUNITY): Payer: Self-pay | Admitting: Orthopedic Surgery

## 2013-08-06 MED ORDER — OXYCODONE-ACETAMINOPHEN 5-325 MG PO TABS: 1.0000 | ORAL_TABLET | ORAL | Status: DC | PRN

## 2013-08-06 MED ORDER — DIAZEPAM 2 MG PO TABS: 2.0000 mg | ORAL_TABLET | Freq: Four times a day (QID) | ORAL | Status: DC | PRN

## 2013-08-06 NOTE — Op Note (Signed)
NAMEMARGRETTA, ZAMORANO NO.:  [id number]  MEDICAL RECORD NO.:  [id number]  LOCATION:  5N21C                        FACILITY:  [facility]  PHYSICIAN:  Vania Rea. Khamryn Calderone, M.D.  DATE OF BIRTH:  [birth year]  DATE OF PROCEDURE:  08/05/2013 DATE OF DISCHARGE:                              OPERATIVE REPORT   PREOPERATIVE DIAGNOSIS:  End-stage left shoulder osteoarthritis.  POSTOPERATIVE DIAGNOSIS:  End-stage left shoulder osteoarthritis.  PROCEDURE:  Left total shoulder arthroplasty utilizing a press-fit size 10 [employer] stem with a 44 x 18 eccentric head and a cemented 44 glenoid.  SURGEON:  Vania Rea. Louvina Cleary, MD  ASSISTANT:  Lucita Lora. Shuford, PA-C  ANESTHESIA:  General endotracheal as well as an interscalene block.  ESTIMATED BLOOD LOSS:  200 mL.  DRAINS:  None.  HISTORY:  Ms. Shiffer is a 67 year old female who has had chronic and progressively increasing left shoulder pain related to end-stage glenohumeral arthritis with significant bony deformity, peripheral osteophytes, and subchondral sclerosis with deformity of the humeral head.  Her symptoms have now created severe ongoing pain and functional limitations.  She was brought to the operating room at this time for planned left total shoulder arthroplasty.  Preoperatively, I counseled Ms. Manfre on treatment options as well as risks versus benefits thereof.  Possible surgical complications were reviewed including potential for bleeding, infection, neurovascular injury, persistent pain, loss of motion, failure of the implant, anesthetic complication, and the possible need for additional surgery. She understands and accepts and agrees with our planned procedure.  DESCRIPTION OF PROCEDURE:  After undergoing routine preop evaluation, the patient received prophylactic antibiotics.  An interscalene block was established in the holding area by the Anesthesia Department. Placed supine on the operating table,  underwent smooth induction of a general endotracheal anesthesia.  Placed in the beach-chair position and appropriately padded and protected.  Left shoulder girdle region was then sterilely prepped and draped in standard fashion.  Time-out was called.  An anterior approach to the left shoulder was made through deltopectoral interval, incision approximately 15 cm in length.  Skin flaps were elevated and electrocautery was used for hemostasis.  The deltopectoral interval was identified and developed bluntly with the cephalic vein retracted laterally with the deltoid and the upper cm of the pectoralis major was tenotomized distally to improve exposure. Adhesions in the subacromial/subdeltoid region were divided and the conjoined tendon was identified, mobilized, and retracted medially.  At this point, we unroofed the biceps tendon from the bicipital groove and tenotomized this for later tenodesis.  The large amount of synovial fluid was evacuated.  We then divided the rotator cuff at the rotator interval to the base of the coracoid.  Then, divided the subscapularis from the lesser tuberosity, leaving a lateral cuff of tissue for later repair and tagging the free margin of the subscap with a series of #2 FiberWire sutures.  I then divided the capsular attachments from the anterior row, inferior aspect of the humeral head and neck, allowing delivery of the head through the wound, and there was a very large inferior osteophyte.  Carefully protecting the rotator cuff superiorly and posteriorly, and then outlined our proposed humeral head resection. This was then completed with an oscillating  saw.  We then used a rongeur and osteotomes to remove the osteophytes, emanating from the inferior aspect of the humeral head.  Hand reaming was then performed at the humeral canal up to size 10.  We then performed broaching of the proximal humerus, then broached up to size 10 with excellent fit and fixation.   At this point, we placed a metal cap over the metaphyseal aspect of the proximal humerus.  I then performed an exposure of the glenoid with combination of pitchfork, snake tongue, and Fukuda retractors.  I performed a circumferential labral resection, removing the residual proximal stump of the biceps and triceps as well.  I then performed a capsular release anteriorly, mobilizing the subscapularis. Once we gained circumferential exposure of the glenoid, we then placed a guide pin into the center of the glenoid and reamed this with the 40 reamer which showed the best fit.  Once we achieved a subchondral bony surface, placed our central drill hole followed by the peripheral PEG holes, removed all residual soft tissue bony debris and the trial implant showed excellent fit and fixation.  At this point, cement was mixed and introduced into the peripheral peg holes and then the final 40 pegged glenoid was impacted into position with excellent fit and fixation.  Cement was allowed to harden and then we returned our attention back to the proximal humerus.  A final size 10 stem was then introduced using bone graft about the metaphyseal region obtained from the resected humeral head.  This achieved excellent fit and fixation at approximately 30 degrees of retroversion.  We then performed trial reductions and the 44 x 18 eccentric head had the best soft tissue balance with good coverage of the proximal humerus.  The final head was then impacted after the [facility] taper was meticulously cleaned and dried. Final reduction was then performed, with the soft tissue balance and shoulder motion much to our satisfaction.  At this point, the wound was irrigated.  Hemostasis was obtained.  Subscapularis was repaired back to the lesser tuberosity through the soft tissue cuff of tissue using #2 FiberWire and then rotator interval was then reapproximated with a figure-of-eight #2 FiberWire.  The biceps tendon was  then tenodesed at the level of bicipital groove at the upper level of the pectoralis major.  Wound was then again irrigated.  The deltopectoral interval was at this point reapproximated with a series of #1 Vicryl sutures.  2-0 Vicryl was used for the subcu layer and intracuticular 3-0 Monocryl for the skin followed by Steri-Strips.  Dry dressings were then applied. Left arm was placed into a sling.  The patient was awakened, extubated, and taken to the recovery room in stable condition  Tracy A. Shuford, PA-C, was used as an Geophysicist/field seismologist throughout this case, essential for help with positioning of the extremity, management of the retractors, tissue manipulation, placement of the prosthesis, wound closure, and intraoperative decision making.     Vania Rea. Anacleto Batterman, M.D.     KMS/MEDQ  D:  08/05/2013  T:  08/06/2013  Job:  [id number]

## 2013-08-06 NOTE — Discharge Summary (Signed)
PATIENT ID:      Regina Rhodes  MRN:     [id number] DOB/AGE:    68-Apr-1947 / 68 y.o.     DISCHARGE SUMMARY  ADMISSION DATE:    08/05/2013 DISCHARGE DATE:    ADMISSION DIAGNOSIS: osteoarthritis of the left shoulder Past Medical History  Diagnosis Date  . Hypertension   . Hyperlipemia   . Fibromyalgia   . Heart murmur     DISCHARGE DIAGNOSIS:   Active Problems:   S/P shoulder replacement   PROCEDURE: Procedure(s): LEFT TOTAL SHOULDER ARTHROPLASTY on 08/05/2013  CONSULTS:   none  HISTORY:  See H&P in chart.  HOSPITAL COURSE:  Regina Rhodes is a 67 y.o. admitted on 08/05/2013 with a chief complaint of left shoulder pain and dysfuncyion, and found to have a diagnosis of osteoarthritis of the left shoulder.  They were brought to the operating room on 08/05/2013 and underwent Procedure(s): LEFT TOTAL SHOULDER ARTHROPLASTY.    They were given perioperative antibiotics: Anti-infectives   Start     Dose/Rate Route Frequency Ordered Stop   08/05/13 1200  ceFAZolin (ANCEF) IVPB 1 g/50 mL premix     1 g 100 mL/hr over 30 Minutes Intravenous Every 6 hours 08/05/13 1057 08/06/13 0026   08/05/13 0600  ceFAZolin (ANCEF) 3 g in dextrose 5 % 50 mL IVPB     3 g 160 mL/hr over 30 Minutes Intravenous On call to O.R. 08/04/13 1422 08/05/13 0737    .  Patient underwent the above named procedure and tolerated it well. The following day they were hemodynamically stable and pain was controlled on oral analgesics. They were neurovascularly intact to the operative extremity. OT was ordered and worked with patient per protocol. They were medically and orthopaedically stable for discharge on Day 1.    DIAGNOSTIC STUDIES:  RECENT RADIOGRAPHIC STUDIES :  Dg Chest 2 View  08/04/2013   CLINICAL DATA:  Preop for left shoulder arthroplasty.  Hypertension.  EXAM: CHEST  2 VIEW  COMPARISON:  None.  FINDINGS: Cardiac silhouette is mildly enlarged. Mediastinum and hilar contours are within  normal limits. The lungs are clear.  The bony thorax is demineralized. There are arthropathic changes of both shoulders and incompletely imaged changes from a lumbar spine posterior fusion.  IMPRESSION: No acute cardiopulmonary disease.   Electronically Signed   By: Amie Portland M.D.   On: 08/04/2013 16:11    RECENT VITAL SIGNS:  Patient Vitals for the past 24 hrs:  BP Temp Temp src Pulse Resp SpO2  08/06/13 0616 123/87 mmHg 98.1 F (36.7 C) Oral 59 18 97 %  08/05/13 2142 119/51 mmHg 99.3 F (37.4 C) Oral 71 18 96 %  08/05/13 1250 119/54 mmHg 98.1 F (36.7 C) Oral 68 16 100 %  08/05/13 1050 148/70 mmHg 97.3 F (36.3 C) Oral 67 16 98 %  08/05/13 1034 - 98.2 F (36.8 C) - 66 14 100 %  08/05/13 1030 141/74 mmHg - - 66 15 100 %  08/05/13 1019 149/69 mmHg - - 66 14 99 %  08/05/13 1015 134/63 mmHg - - 66 18 100 %  08/05/13 1006 134/63 mmHg - - 66 16 100 %  08/05/13 1004 136/66 mmHg - - 62 15 100 %  08/05/13 1000 134/63 mmHg - - 63 16 100 %  08/05/13 0945 136/62 mmHg 97.4 F (36.3 C) - 65 14 100 %  .  RECENT EKG RESULTS:    Orders placed in visit on 07/16/13  . EKG 12-LEAD  DISCHARGE INSTRUCTIONS:    DISCHARGE MEDICATIONS:     Medication List    STOP taking these medications       acetaminophen 500 MG tablet  Commonly known as:  TYLENOL     diphenhydramine-acetaminophen 25-500 MG Tabs  Commonly known as:  TYLENOL PM      TAKE these medications       ALEVE PO  Take 1 tablet by mouth daily.     diazepam 2 MG tablet  Commonly known as:  VALIUM  Take 1 tablet (2 mg total) by mouth every 6 (six) hours as needed for muscle spasms or sedation.     diclofenac sodium 1 % Gel  Commonly known as:  VOLTAREN  Apply 2 g topically 3 (three) times daily as needed (for shoulder pain).     DULoxetine 60 MG capsule  Commonly known as:  CYMBALTA  Take 60 mg by mouth daily.     ezetimibe 10 MG tablet  Commonly known as:  ZETIA  Take 10 mg by mouth daily.     fosinopril 40  MG tablet  Commonly known as:  MONOPRIL  Take 40 mg by mouth daily.     oxyCODONE-acetaminophen 5-325 MG per tablet  Commonly known as:  PERCOCET  Take 1-2 tablets by mouth every 4 (four) hours as needed.     rosuvastatin 20 MG tablet  Commonly known as:  CRESTOR  Take 20 mg by mouth daily.        FOLLOW UP VISIT:       Follow-up Information   Follow up with Senaida Lange, MD. (call to be seen in 10-14 days)    Specialty:  Orthopedic Surgery   Contact information:   [address] [place] [address] [phone]       DISCHARGE TO: home  DISPOSITION: good  DISCHARGE CONDITION:  [name] for Dr. Francena Hanly 08/06/2013, 8:13 AM

## 2013-08-06 NOTE — Progress Notes (Signed)
RN reviewed discharge instructions with patient. All questions answered. Patient given instructions and prescriptions from MD.  Patient wheeled down with volunteer services to family car.

## 2013-08-06 NOTE — Evaluation (Addendum)
Occupational Therapy Evaluation Patient Details Name: Regina Rhodes MRN: [id number] DOB: March 31, 1946 Today's Date: 08/06/2013 Time: 7829-5621 OT Time Calculation (min): 40 min  OT Assessment / Plan / Recommendation History of present illness L TSA on 08/05/13   Clinical Impression   Pt presents s/p L TSA on 08/05/13. She plans to d/c home later today w/ PRN family assist and was educated in approved shoulder protocol as per Dr. Rennis Chris. She required occasional vc's for precautions. She is independent w/ transfers and Min guard assist for donning shoes, shirt & sling. Recommend f/u w/ Dr. Rennis Chris for progression of shoulder program. She reports no further acute OT needs at this time, will sign off.    OT Assessment  Progress rehab of shoulder as ordered by MD at follow-up appointment    Follow Up Recommendations  No OT follow up    Barriers to Discharge      Equipment Recommendations  None recommended by OT    Recommendations for Other Services    Frequency       Precautions / Restrictions Precautions Precautions: Shoulder;Fall Shoulder Interventions: Shoulder sling/immobilizer;Off for dressing/bathing/exercises Precaution Booklet Issued: Yes (comment) Precaution Comments: Handout issued and reviewed Required Braces or Orthoses: Sling Restrictions Weight Bearing Restrictions: Yes LUE Weight Bearing: Non weight bearing   Pertinent Vitals/Pain No c/o, denies pain.    ADL  Eating/Feeding: Performed;Independent Where Assessed - Eating/Feeding: Edge of bed;Chair Grooming: Performed;Modified independent Where Assessed - Grooming: Unsupported standing Upper Body Bathing: Performed;Chest;Right arm;Left arm;Abdomen;Set up Where Assessed - Upper Body Bathing: Unsupported standing Lower Body Bathing: Simulated;Modified independent Where Assessed - Lower Body Bathing: Supported sit to stand Upper Body Dressing: Performed;Supervision/safety;Min guard Where Assessed - Upper Body  Dressing: Unsupported standing Lower Body Dressing: Performed;Supervision/safety;Min guard (Min guard for tying shoes only. VC's for safety/precautions) Where Assessed - Lower Body Dressing: Unsupported sit to stand Toilet Transfer: Performed;Modified independent Toilet Transfer Method: Sit to Barista: Comfort height toilet Toileting - Clothing Manipulation and Hygiene: Performed;Modified independent Where Assessed - Toileting Clothing Manipulation and Hygiene: Sit on 3-in-1 or toilet;Sit to stand from 3-in-1 or toilet Tub/Shower Transfer Method: Not assessed Equipment Used: Other (comment) (Sling LUE) Transfers/Ambulation Related to ADLs: Pt overall Mod I transfers and functional mobility in room w/o AD. ADL Comments: Pt was educated in role of OT & provided w/ HEP/shoulder protocol as approved by Dr Rennis Chris, ADL's, don/doff sling LUE. Verbal, written instruction provided and HEP performed x 10 reps each. Pt is very independent and requires vc's for safety/precautions. She will have asssit at d/c from niece, brother and sister in law.    OT Diagnosis:    OT Problem List:   OT Treatment Interventions:     OT Goals(Current goals can be found in the care plan section) Acute Rehab OT Goals Patient Stated Goal: D/C home later today  Visit Information  Last OT Received On: 08/06/13 History of Present Illness: L TSA on 08/05/13       Prior Functioning     Home Living Family/patient expects to be discharged to:: Private residence Living Arrangements: Alone Available Help at Discharge: Family Type of Home: Other(Comment) (Townhouse all one level) Home Layout: One level Home Equipment: Hand held shower head;Grab bars - tub/shower Prior Function Level of Independence: Independent Communication Communication: No difficulties Dominant Hand: Right    Vision/Perception Vision - History Baseline Vision: Wears contacts Patient Visual Report: No change from  baseline   Cognition  Cognition Arousal/Alertness: Awake/alert Behavior During Therapy: [facility] for tasks assessed/performed  Overall Cognitive Status: Within Functional Limits for tasks assessed    Extremity/Trunk Assessment Upper Extremity Assessment Upper Extremity Assessment: LUE deficits/detail LUE Deficits / Details: LUE s/p TSA on 08/05/13 LUE: Unable to fully assess due to immobilization Lower Extremity Assessment Lower Extremity Assessment: Overall WFL for tasks assessed Cervical / Trunk Assessment Cervical / Trunk Assessment: Normal    Mobility Bed Mobility Bed Mobility: Supine to Sit;Sitting - Scoot to Edge of Bed Supine to Sit: 7: Independent Sitting - Scoot to [place] of Bed: 7: Independent Transfers Transfers: Sit to Stand;Stand to Sit Sit to Stand: 7: Independent;From bed;From chair/3-in-1;From toilet Stand to Sit: 7: Independent;To bed;To chair/3-in-1;To toilet;Without upper extremity assist Details for Transfer Assistance: Pt demonstrates good technique during transfers   Exercise Shoulder Exercises Pendulum Exercise: PROM;Left;5 reps;Standing Shoulder Flexion: PROM;Left;5 reps;Supine Shoulder ABduction: PROM;Right;5 reps;Supine Shoulder External Rotation: PROM;Right;5 reps;Supine Elbow Flexion: AROM;Right;5 reps;Seated Elbow Extension: AROM;Right;5 reps;Seated Wrist Flexion: AROM;Right;5 reps;Seated Wrist Extension: AROM;Right;5 reps;Seated Digit Composite Flexion: AROM;Right;5 reps;Seated Composite Extension: AROM;Right;5 reps;Seated Neck Lateral Flexion - Right: AROM Neck Lateral Flexion - Left: AROM Donning/doffing shirt without moving shoulder: Min-guard Method for sponge bathing under operated UE: Modified independent Donning/doffing sling/immobilizer: Min-guard Correct positioning of sling/immobilizer: Modified independent Pendulum exercises (written home exercise program): Modified independent ROM for elbow, wrist and digits of operated UE: Modified  independent Sling wearing schedule (on at all times/off for ADL's): Modified independent Proper positioning of operated UE when showering: Supervision/safety Positioning of UE while sleeping: Modified independent      End of Session OT - End of Session Equipment Utilized During Treatment: Other (comment) (LUE sling) Activity Tolerance: Patient tolerated treatment well Patient left: in chair;with call bell/phone within reach;with family/visitor present Nurse Communication: Mobility status  GO     Roselie Awkward Dixon 08/06/2013, 10:36 AM

## 2014-12-11 IMAGING — CR DG CHEST 2V
2 series · 2 of 2 positions shown · non-contrast
Comparison: None.

CLINICAL DATA: Preop for left shoulder arthroplasty.  Hypertension.

EXAM:
CHEST  2 VIEW

[w chest pa]
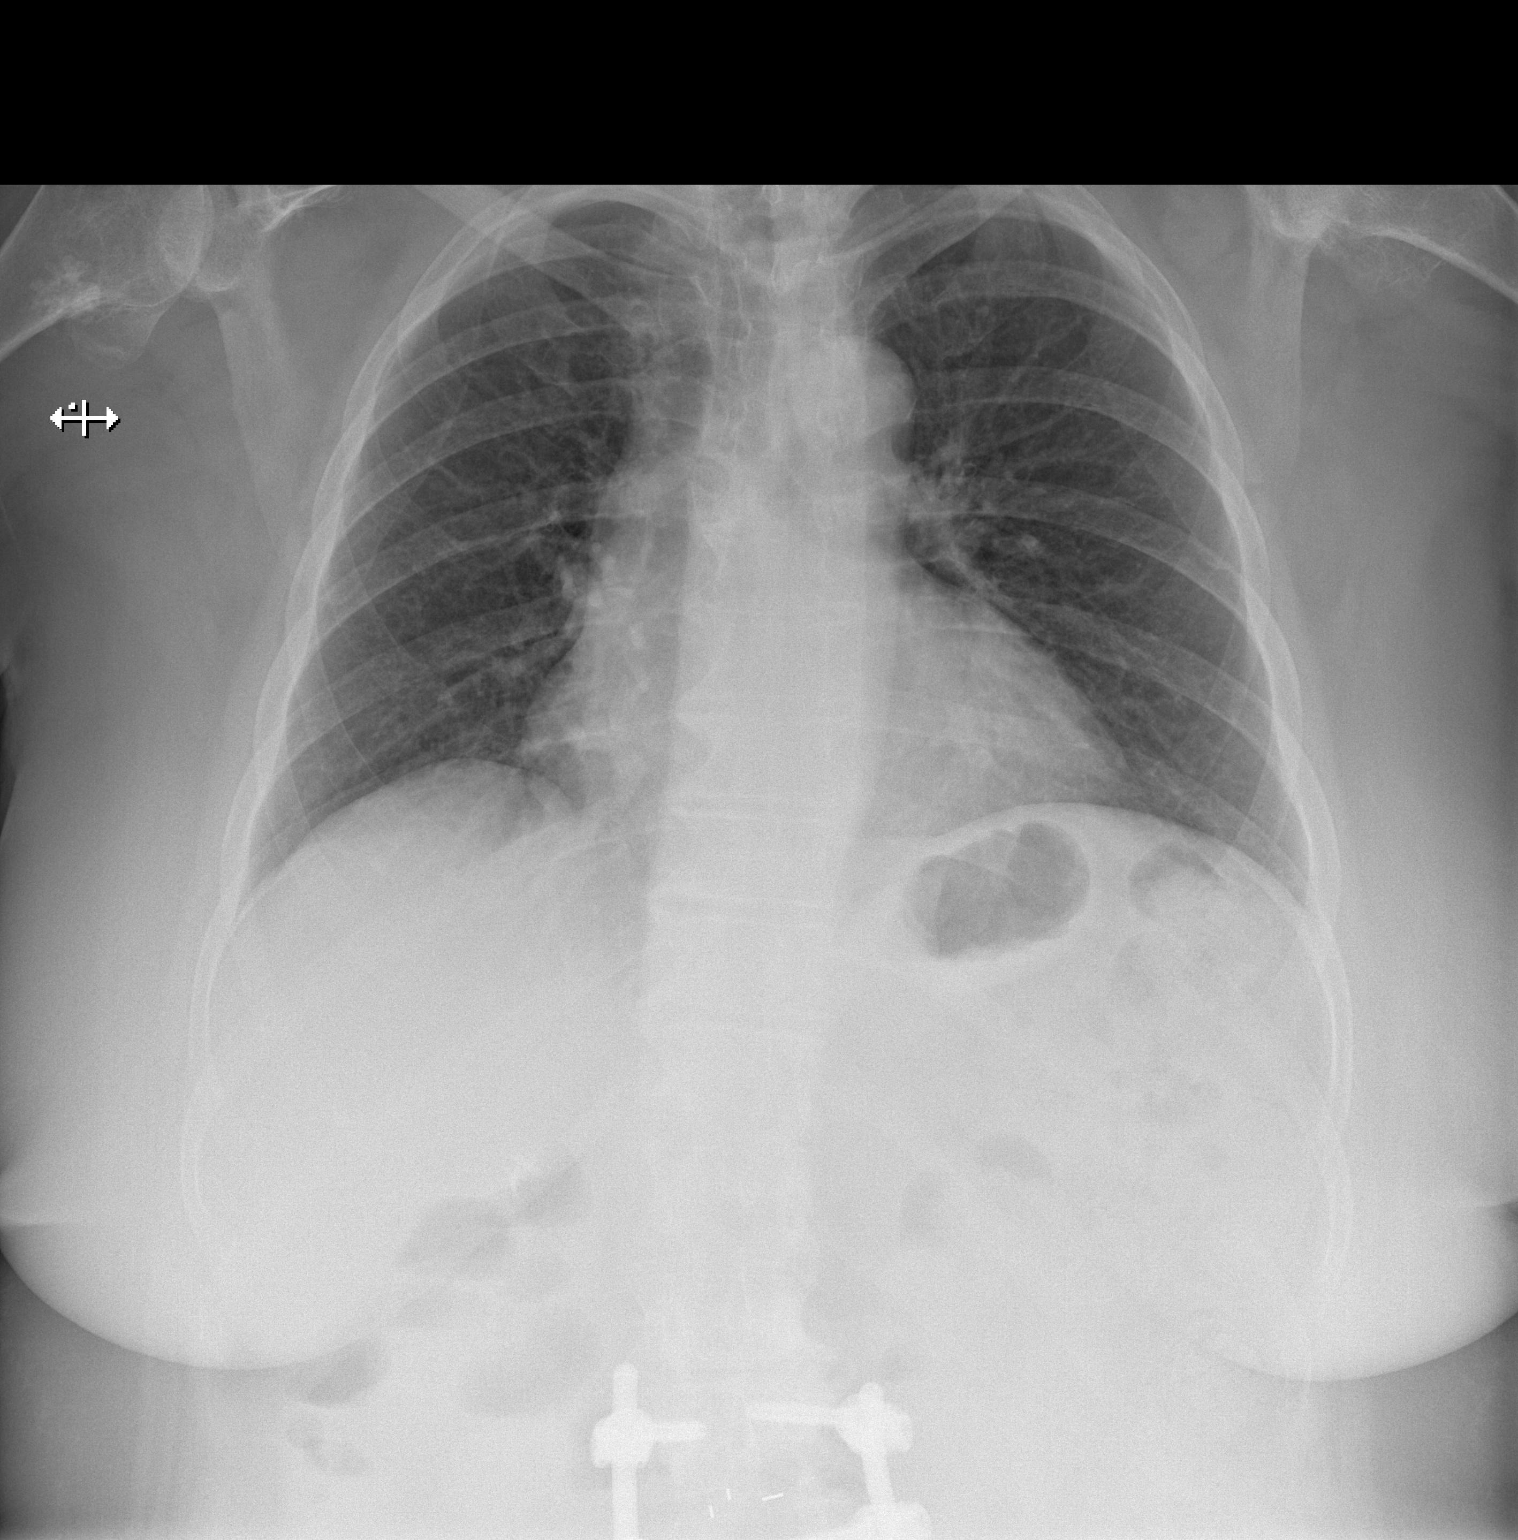

[w chest lat]
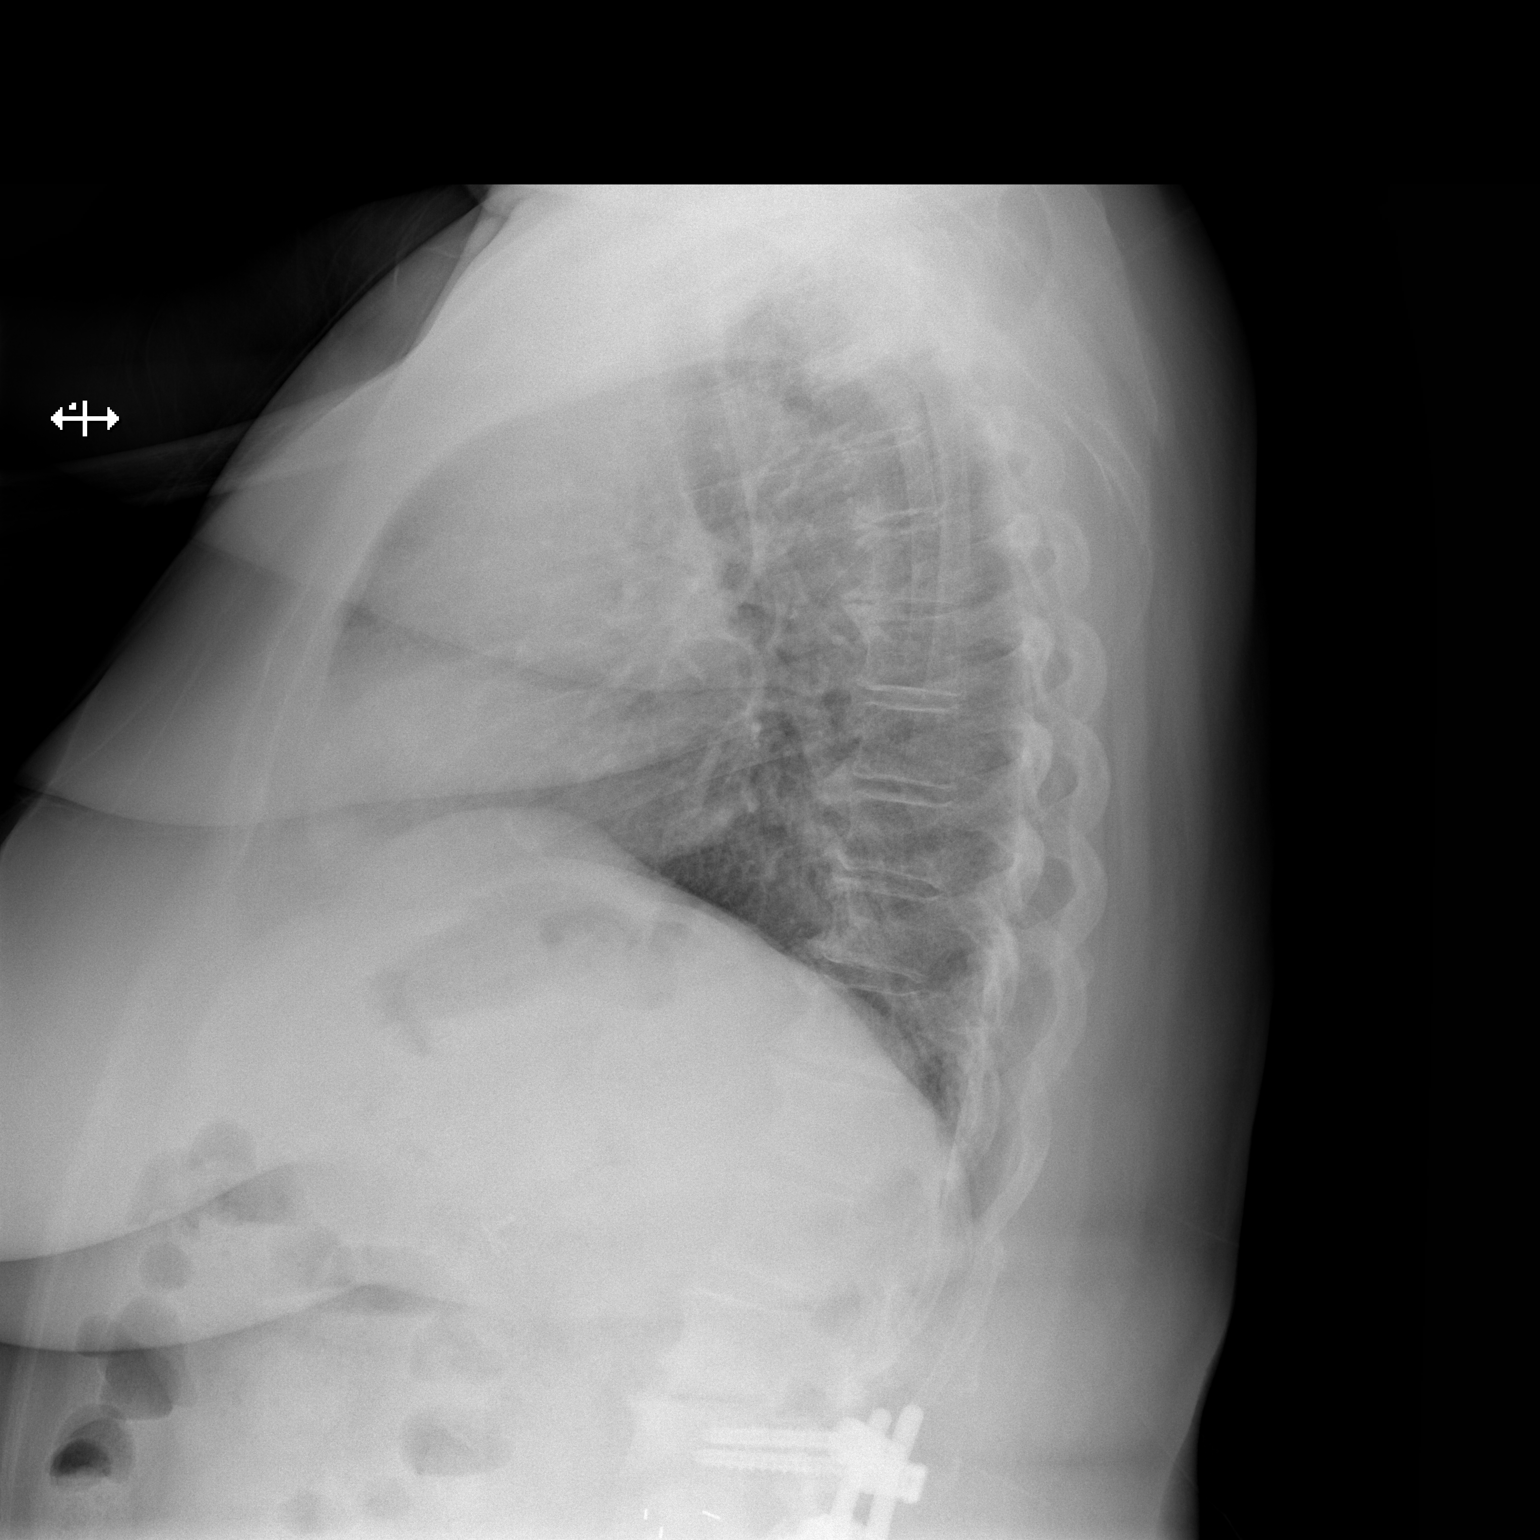

[2 of 2 positions shown; findings below may reference images not displayed]

FINDINGS: Cardiac silhouette is mildly enlarged. Mediastinum and hilar
contours are within normal limits. The lungs are clear.

The bony thorax is demineralized. There are arthropathic changes of
both shoulders and incompletely imaged changes from a lumbar spine
posterior fusion.
IMPRESSION: No acute cardiopulmonary disease.

## 2015-08-23 ENCOUNTER — Encounter (HOSPITAL_COMMUNITY): Payer: Self-pay

## 2015-08-23 ENCOUNTER — Encounter (HOSPITAL_COMMUNITY)
Admission: RE | Admit: 2015-08-23 | Discharge: 2015-08-23 | Disposition: A | Discharge status: Home / Self Care | Payer: PRIVATE HEALTH INSURANCE | Source: Ambulatory Visit | Attending: Orthopedic Surgery | Admitting: Orthopedic Surgery

## 2015-08-23 DIAGNOSIS — I10 Essential (primary) hypertension: Secondary | ICD-10-CM | POA: Diagnosis not present

## 2015-08-23 DIAGNOSIS — Z01812 Encounter for preprocedural laboratory examination: Secondary | ICD-10-CM | POA: Insufficient documentation

## 2015-08-23 DIAGNOSIS — E785 Hyperlipidemia, unspecified: Secondary | ICD-10-CM | POA: Insufficient documentation

## 2015-08-23 DIAGNOSIS — Z981 Arthrodesis status: Secondary | ICD-10-CM | POA: Diagnosis not present

## 2015-08-23 DIAGNOSIS — M797 Fibromyalgia: Secondary | ICD-10-CM | POA: Insufficient documentation

## 2015-08-23 DIAGNOSIS — Z01818 Encounter for other preprocedural examination: Secondary | ICD-10-CM | POA: Insufficient documentation

## 2015-08-23 DIAGNOSIS — Z96642 Presence of left artificial hip joint: Secondary | ICD-10-CM | POA: Insufficient documentation

## 2015-08-23 DIAGNOSIS — Z79899 Other long term (current) drug therapy: Secondary | ICD-10-CM | POA: Diagnosis not present

## 2015-08-23 DIAGNOSIS — M19011 Primary osteoarthritis, right shoulder: Secondary | ICD-10-CM | POA: Diagnosis not present

## 2015-08-23 DIAGNOSIS — Z96653 Presence of artificial knee joint, bilateral: Secondary | ICD-10-CM | POA: Insufficient documentation

## 2015-08-23 HISTORY — DX: Unspecified osteoarthritis, unspecified site: M19.90

## 2015-08-23 HISTORY — DX: Bronchitis, not specified as acute or chronic: J40

## 2015-08-23 LAB — COMPREHENSIVE METABOLIC PANEL
ALBUMIN: 4.1 g/dL (ref 3.5–5.0)
ALT: 21 U/L (ref 14–54)
AST: 24 U/L (ref 15–41)
Alkaline Phosphatase: 65 U/L (ref 38–126)
Anion gap: 6 (ref 5–15)
BUN: 12 mg/dL (ref 6–20)
CHLORIDE: 105 mmol/L (ref 101–111)
CO2: 30 mmol/L (ref 22–32)
CREATININE: 0.72 mg/dL (ref 0.44–1.00)
Calcium: 9.6 mg/dL (ref 8.9–10.3)
GFR calc non Af Amer: >60 mL/min (ref >60)
GLUCOSE: 98 mg/dL (ref 65–99)
Potassium: 4.3 mmol/L (ref 3.5–5.1)
SODIUM: 141 mmol/L (ref 135–145)
Total Bilirubin: 1 mg/dL (ref 0.3–1.2)
Total Protein: 7 g/dL (ref 6.5–8.1)

## 2015-08-23 LAB — CBC WITH DIFFERENTIAL/PLATELET
BASOS ABS: 0 10*3/uL (ref 0.0–0.1)
BASOS PCT: 0 %
EOS PCT: 3 %
Eosinophils Absolute: 0.2 10*3/uL (ref 0.0–0.7)
HCT: 43 % (ref 36.0–46.0)
Hemoglobin: 13.9 g/dL (ref 12.0–15.0)
Lymphocytes Relative: 27 %
Lymphs Abs: 1.6 10*3/uL (ref 0.7–4.0)
MCH: 31.8 pg (ref 26.0–34.0)
MCHC: 32.3 g/dL (ref 30.0–36.0)
MCV: 98.4 fL (ref 78.0–100.0)
Monocytes Absolute: 0.5 10*3/uL (ref 0.1–1.0)
Monocytes Relative: 9 %
Neutro Abs: 3.6 10*3/uL (ref 1.7–7.7)
Neutrophils Relative %: 61 %
PLATELETS: 210 10*3/uL (ref 150–400)
RBC: 4.37 MIL/uL (ref 3.87–5.11)
RDW: 12.5 % (ref 11.5–15.5)
WBC: 5.8 10*3/uL (ref 4.0–10.5)

## 2015-08-23 LAB — PROTIME-INR
INR: 1.01 (ref 0.00–1.49)
Prothrombin Time: 13.5 seconds (ref 11.6–15.2)

## 2015-08-23 LAB — SURGICAL PCR SCREEN
MRSA, PCR: NEGATIVE
Staphylococcus aureus: POSITIVE — AB

## 2015-08-23 LAB — APTT: APTT: 26 s (ref 24–37)

## 2015-08-23 NOTE — Pre-Procedure Instructions (Signed)
Regina Rhodes  08/23/2015      [facility] - [place] - [address] [place] [address] Phone: [phone] Fax: [phone]  [id number] - [place], [place] - [address] [place] [address] Phone: [phone] Fax: [phone]    Your procedure is scheduled on Thursday, January 5th   [facility] Admitting at 5:30 AM   Call this number if you have problems the morning of surgery:  [phone]   Remember:  Do not eat food or drink liquids after midnight Wednesday.  Take these medicines the morning of surgery with A SIP OF WATER : Cymbalta    Do not wear jewelry, make-up or nail polish.  Do not wear lotions, powders, or perfumes.  You may NOT wear deodorant the day of surgery.  Do not shave 48 hours prior to surgery.     Do not bring valuables to the hospital.  [facility] is not responsible for any belongings or valuables.  Contacts, dentures or bridgework may not be worn into surgery.  Leave your suitcase in the car.  After surgery it may be brought to your room.  For patients admitted to the hospital, discharge time will be determined by your treatment team.   Name and phone number of your driver:     Please read over the following fact sheets that you were given. Pain Booklet, Coughing and Deep Breathing, MRSA Information and Surgical Site Infection Prevention

## 2015-08-23 NOTE — Progress Notes (Addendum)
[place]  Dr. Pennie RushingKevin Convoy. (cardio) [phone] Just for comparison baselines.(requesting records) She has no other doctors here in [place].   She denies any chest pains, sob. Does have a "functional murmur"  She did see Dr. Erlene QuanJ Berry prior to her 2014 shoulder surgery. PCP is Dr. Charma IgoPhillip Negus

## 2015-08-25 ENCOUNTER — Encounter (HOSPITAL_COMMUNITY): Payer: Self-pay

## 2015-08-25 NOTE — Progress Notes (Signed)
Anesthesia Chart Review: Patient is a 69 year old female scheduled for right total shoulder arthroplasty on 08/31/2015 by Dr. Rennis ChrisSupple.  History includes nonsmoker, "functional" murmur (midl AR, trace MR/TR 2013), hypertension, hyperlipidemia, fibromyalgia, arthritis, bilateral TKA, lumbar fusion '04 cholecystectomy, left THA '07, appendectomy, left total shoulder '14. BMI is consistent with obesity. [place] PCP there is Dr. Alicia AmelPhilip Negus. Cardiologist is Dr. Pennie RushingKevin Convoy [phone]). Locally, she saw Dr. Nanetta BattyJonathan Berry prior to her 2012 shoulder surgery. She does not have a PCP locally.    Meds include Cymablta, Zetia, fosinopril, Crestor.  08/23/15 EKG: NSR.  07/27/13 Nuclear stress test: Overall Impression: Low risk stress nuclear study small area of breast attenuation artifact. No ischemia. LV Wall Motion: NL LV Function; NL Wall Motion; EF 64%.  [facility] - 08/01/2012 Echo: Sinus rhythm. Normal LV size, wall thickness, and systolic function. EF greater than 55%. Impaired relaxation. Mild aortic sclerosis without stenosis. Mild aortic regurgitation. Mild MAC. Trace MR/TR. RVSP upper limits of normal.  -02/09/2009 stress test: Conclusion: Blood pressure response to exercise: Resting hypertension - exaggerated functional capacity. Moderate decreased 20-30% rest EF ~ 60% with exercise increase EF. No evidence for ischemia by echo.    Preoperative labs noted.   If no acute changes then I anticipate that she can proceed as planned. Of note, her BP was elevated at PAT. She is on an ACEI, so was not instructed to take it on the morning of surgery. If BP elevated on arrival, may have to consider having her take. Will defer to her anesthesiologist.  Shonna Chockllison Jaemarie Hochberg, PA-C [facility]/Anesthesiology Phone [phone] 08/25/2015 12:56 PM

## 2015-08-30 MED ORDER — LACTATED RINGERS IV SOLN
INTRAVENOUS | Status: DC
  Administered 2015-08-31: 07:00:00 via INTRAVENOUS

## 2015-08-30 MED ORDER — CEFAZOLIN SODIUM-DEXTROSE 2-3 GM-% IV SOLR
2.0000 g | INTRAVENOUS | Status: AC
  Administered 2015-08-31: 2 g via INTRAVENOUS
  Filled 2015-08-30: qty 50

## 2015-08-30 MED ORDER — CHLORHEXIDINE GLUCONATE 4 % EX LIQD: 60.0000 mL | Freq: Once | CUTANEOUS | Status: DC

## 2015-08-31 ENCOUNTER — Ambulatory Visit (HOSPITAL_COMMUNITY): Payer: PRIVATE HEALTH INSURANCE | Admitting: Anesthesiology

## 2015-08-31 ENCOUNTER — Ambulatory Visit (HOSPITAL_COMMUNITY): Payer: PRIVATE HEALTH INSURANCE | Admitting: Vascular Surgery

## 2015-08-31 ENCOUNTER — Encounter (HOSPITAL_COMMUNITY)
Admission: RE | Disposition: A | Discharge status: Home / Self Care | Payer: Self-pay | Source: Ambulatory Visit | Attending: Orthopedic Surgery

## 2015-08-31 ENCOUNTER — Encounter (HOSPITAL_COMMUNITY): Payer: Self-pay | Admitting: Anesthesiology

## 2015-08-31 ENCOUNTER — Observation Stay (HOSPITAL_COMMUNITY)
Admission: RE | Admit: 2015-08-31 | Discharge: 2015-09-01 | Disposition: A | Discharge status: Home / Self Care | Payer: PRIVATE HEALTH INSURANCE | Source: Ambulatory Visit | Attending: Orthopedic Surgery | Admitting: Orthopedic Surgery

## 2015-08-31 DIAGNOSIS — E785 Hyperlipidemia, unspecified: Secondary | ICD-10-CM | POA: Diagnosis not present

## 2015-08-31 DIAGNOSIS — Z96653 Presence of artificial knee joint, bilateral: Secondary | ICD-10-CM | POA: Insufficient documentation

## 2015-08-31 DIAGNOSIS — I10 Essential (primary) hypertension: Secondary | ICD-10-CM | POA: Insufficient documentation

## 2015-08-31 DIAGNOSIS — Z96612 Presence of left artificial shoulder joint: Secondary | ICD-10-CM | POA: Insufficient documentation

## 2015-08-31 DIAGNOSIS — Z6837 Body mass index (BMI) 37.0-37.9, adult: Secondary | ICD-10-CM | POA: Diagnosis not present

## 2015-08-31 DIAGNOSIS — Z96642 Presence of left artificial hip joint: Secondary | ICD-10-CM | POA: Insufficient documentation

## 2015-08-31 DIAGNOSIS — Z96611 Presence of right artificial shoulder joint: Secondary | ICD-10-CM

## 2015-08-31 DIAGNOSIS — M19011 Primary osteoarthritis, right shoulder: Secondary | ICD-10-CM | POA: Diagnosis present

## 2015-08-31 DIAGNOSIS — Z96619 Presence of unspecified artificial shoulder joint: Secondary | ICD-10-CM

## 2015-08-31 DIAGNOSIS — M797 Fibromyalgia: Secondary | ICD-10-CM | POA: Insufficient documentation

## 2015-08-31 HISTORY — PX: TOTAL SHOULDER ARTHROPLASTY: SHX126

## 2015-08-31 SURGERY — ARTHROPLASTY, SHOULDER, TOTAL
Anesthesia: Regional | Site: Shoulder | Laterality: Right

## 2015-08-31 MED ORDER — HYDRALAZINE HCL 20 MG/ML IJ SOLN
INTRAMUSCULAR | Status: AC
  Filled 2015-08-31: qty 1

## 2015-08-31 MED ORDER — PHENOL 1.4 % MT LIQD: 1.0000 | OROMUCOSAL | Status: DC | PRN

## 2015-08-31 MED ORDER — PHENYLEPHRINE 40 MCG/ML (10ML) SYRINGE FOR IV PUSH (FOR BLOOD PRESSURE SUPPORT)
PREFILLED_SYRINGE | INTRAVENOUS | Status: AC
  Filled 2015-08-31: qty 10

## 2015-08-31 MED ORDER — BISACODYL 5 MG PO TBEC: 5.0000 mg | DELAYED_RELEASE_TABLET | Freq: Every day | ORAL | Status: DC | PRN

## 2015-08-31 MED ORDER — LISINOPRIL 40 MG PO TABS
40.0000 mg | ORAL_TABLET | Freq: Every day | ORAL | Status: DC
  Administered 2015-08-31 – 2015-09-01 (×2): 40 mg via ORAL
  Filled 2015-08-31 (×2): qty 1

## 2015-08-31 MED ORDER — PROPOFOL 10 MG/ML IV BOLUS
INTRAVENOUS | Status: DC | PRN
  Administered 2015-08-31: 200 mg via INTRAVENOUS

## 2015-08-31 MED ORDER — CEFAZOLIN SODIUM-DEXTROSE 2-3 GM-% IV SOLR
2.0000 g | Freq: Four times a day (QID) | INTRAVENOUS | Status: AC
  Administered 2015-08-31 – 2015-09-01 (×2): 2 g via INTRAVENOUS
  Filled 2015-08-31 (×3): qty 50

## 2015-08-31 MED ORDER — PHENYLEPHRINE HCL 10 MG/ML IJ SOLN
INTRAMUSCULAR | Status: AC
  Filled 2015-08-31: qty 1

## 2015-08-31 MED ORDER — ARTIFICIAL TEARS OP OINT
TOPICAL_OINTMENT | OPHTHALMIC | Status: AC
  Filled 2015-08-31: qty 3.5

## 2015-08-31 MED ORDER — FENTANYL CITRATE (PF) 100 MCG/2ML IJ SOLN
INTRAMUSCULAR | Status: DC | PRN
  Administered 2015-08-31 (×4): 50 ug via INTRAVENOUS

## 2015-08-31 MED ORDER — ARTIFICIAL TEARS OP OINT
TOPICAL_OINTMENT | OPHTHALMIC | Status: DC | PRN
  Administered 2015-08-31: 1 via OPHTHALMIC

## 2015-08-31 MED ORDER — ONDANSETRON HCL 4 MG/2ML IJ SOLN
INTRAMUSCULAR | Status: DC | PRN
  Administered 2015-08-31: 4 mg via INTRAVENOUS

## 2015-08-31 MED ORDER — EZETIMIBE 10 MG PO TABS
10.0000 mg | ORAL_TABLET | Freq: Every day | ORAL | Status: DC
  Administered 2015-08-31 – 2015-09-01 (×2): 10 mg via ORAL
  Filled 2015-08-31 (×2): qty 1

## 2015-08-31 MED ORDER — DEXTROSE 5 % IV SOLN
10.0000 mg | INTRAVENOUS | Status: DC | PRN
  Administered 2015-08-31: 50 ug/min via INTRAVENOUS

## 2015-08-31 MED ORDER — METOCLOPRAMIDE HCL 5 MG PO TABS: 5.0000 mg | ORAL_TABLET | Freq: Three times a day (TID) | ORAL | Status: DC | PRN

## 2015-08-31 MED ORDER — NEOSTIGMINE METHYLSULFATE 10 MG/10ML IV SOLN
INTRAVENOUS | Status: DC | PRN
  Administered 2015-08-31: 4 mg via INTRAVENOUS

## 2015-08-31 MED ORDER — DOCUSATE SODIUM 100 MG PO CAPS
100.0000 mg | ORAL_CAPSULE | Freq: Two times a day (BID) | ORAL | Status: DC
  Administered 2015-08-31 – 2015-09-01 (×3): 100 mg via ORAL
  Filled 2015-08-31 (×2): qty 1

## 2015-08-31 MED ORDER — FLEET ENEMA 7-19 GM/118ML RE ENEM: 1.0000 | ENEMA | Freq: Once | RECTAL | Status: DC | PRN

## 2015-08-31 MED ORDER — OXYCODONE HCL 5 MG/5ML PO SOLN: 5.0000 mg | Freq: Once | ORAL | Status: DC | PRN

## 2015-08-31 MED ORDER — PROMETHAZINE HCL 25 MG/ML IJ SOLN: 6.2500 mg | INTRAMUSCULAR | Status: DC | PRN

## 2015-08-31 MED ORDER — ACETAMINOPHEN 325 MG PO TABS: 650.0000 mg | ORAL_TABLET | Freq: Four times a day (QID) | ORAL | Status: DC | PRN

## 2015-08-31 MED ORDER — GLYCOPYRROLATE 0.2 MG/ML IJ SOLN
INTRAMUSCULAR | Status: AC
  Filled 2015-08-31: qty 4

## 2015-08-31 MED ORDER — ACETAMINOPHEN 650 MG RE SUPP: 650.0000 mg | Freq: Four times a day (QID) | RECTAL | Status: DC | PRN

## 2015-08-31 MED ORDER — ROCURONIUM BROMIDE 100 MG/10ML IV SOLN
INTRAVENOUS | Status: DC | PRN
  Administered 2015-08-31: 20 mg via INTRAVENOUS
  Administered 2015-08-31: 30 mg via INTRAVENOUS

## 2015-08-31 MED ORDER — HYDROMORPHONE HCL 1 MG/ML IJ SOLN: 0.2500 mg | INTRAMUSCULAR | Status: DC | PRN

## 2015-08-31 MED ORDER — SODIUM CHLORIDE 0.9 % IJ SOLN
INTRAMUSCULAR | Status: AC
  Filled 2015-08-31: qty 10

## 2015-08-31 MED ORDER — LABETALOL HCL 5 MG/ML IV SOLN
INTRAVENOUS | Status: AC
  Filled 2015-08-31: qty 4

## 2015-08-31 MED ORDER — SUCCINYLCHOLINE CHLORIDE 20 MG/ML IJ SOLN
INTRAMUSCULAR | Status: DC | PRN
  Administered 2015-08-31: 120 mg via INTRAVENOUS

## 2015-08-31 MED ORDER — PROPOFOL 10 MG/ML IV BOLUS
INTRAVENOUS | Status: AC
  Filled 2015-08-31: qty 40

## 2015-08-31 MED ORDER — TRANEXAMIC ACID 1000 MG/10ML IV SOLN
1000.0000 mg | INTRAVENOUS | Status: AC
  Administered 2015-08-31: 1000 mg via INTRAVENOUS
  Filled 2015-08-31: qty 10

## 2015-08-31 MED ORDER — KETOROLAC TROMETHAMINE 15 MG/ML IJ SOLN
7.5000 mg | Freq: Four times a day (QID) | INTRAMUSCULAR | Status: AC
  Administered 2015-08-31 – 2015-09-01 (×3): 7.5 mg via INTRAVENOUS
  Filled 2015-08-31 (×3): qty 1

## 2015-08-31 MED ORDER — HYDROMORPHONE HCL 1 MG/ML IJ SOLN
1.0000 mg | INTRAMUSCULAR | Status: DC | PRN
Start: 2015-08-31 — End: 2015-09-01

## 2015-08-31 MED ORDER — LACTATED RINGERS IV SOLN: INTRAVENOUS | Status: DC

## 2015-08-31 MED ORDER — NEOSTIGMINE METHYLSULFATE 10 MG/10ML IV SOLN
INTRAVENOUS | Status: AC
  Filled 2015-08-31: qty 1

## 2015-08-31 MED ORDER — MENTHOL 3 MG MT LOZG
1.0000 | LOZENGE | OROMUCOSAL | Status: DC | PRN
  Administered 2015-08-31: 3 mg via ORAL
  Filled 2015-08-31: qty 9

## 2015-08-31 MED ORDER — ONDANSETRON HCL 4 MG PO TABS: 4.0000 mg | ORAL_TABLET | Freq: Four times a day (QID) | ORAL | Status: DC | PRN

## 2015-08-31 MED ORDER — LIDOCAINE HCL (CARDIAC) 20 MG/ML IV SOLN
INTRAVENOUS | Status: DC | PRN
  Administered 2015-08-31: 60 mg via INTRAVENOUS

## 2015-08-31 MED ORDER — DIPHENHYDRAMINE HCL 12.5 MG/5ML PO ELIX: 12.5000 mg | ORAL_SOLUTION | ORAL | Status: DC | PRN

## 2015-08-31 MED ORDER — HYDRALAZINE HCL 20 MG/ML IJ SOLN
INTRAMUSCULAR | Status: DC | PRN
  Administered 2015-08-31: 5 mg via INTRAVENOUS

## 2015-08-31 MED ORDER — 0.9 % SODIUM CHLORIDE (POUR BTL) OPTIME
TOPICAL | Status: DC | PRN
  Administered 2015-08-31: 1000 mL

## 2015-08-31 MED ORDER — FENTANYL CITRATE (PF) 250 MCG/5ML IJ SOLN
INTRAMUSCULAR | Status: AC
  Filled 2015-08-31: qty 5

## 2015-08-31 MED ORDER — BUPIVACAINE-EPINEPHRINE (PF) 0.5% -1:200000 IJ SOLN
INTRAMUSCULAR | Status: DC | PRN
  Administered 2015-08-31: 25 mL via PERINEURAL

## 2015-08-31 MED ORDER — ROCURONIUM BROMIDE 50 MG/5ML IV SOLN
INTRAVENOUS | Status: AC
  Filled 2015-08-31: qty 1

## 2015-08-31 MED ORDER — DULOXETINE HCL 60 MG PO CPEP
60.0000 mg | ORAL_CAPSULE | Freq: Every day | ORAL | Status: DC
  Administered 2015-09-01: 60 mg via ORAL
  Filled 2015-08-31 (×2): qty 1

## 2015-08-31 MED ORDER — MIDAZOLAM HCL 5 MG/5ML IJ SOLN
INTRAMUSCULAR | Status: DC | PRN
  Administered 2015-08-31: 2 mg via INTRAVENOUS

## 2015-08-31 MED ORDER — DIAZEPAM 2 MG PO TABS: 2.0000 mg | ORAL_TABLET | Freq: Four times a day (QID) | ORAL | Status: DC | PRN

## 2015-08-31 MED ORDER — OXYCODONE HCL 5 MG PO TABS
5.0000 mg | ORAL_TABLET | ORAL | Status: DC | PRN
  Administered 2015-09-01: 10 mg via ORAL
  Filled 2015-08-31: qty 2

## 2015-08-31 MED ORDER — OXYCODONE HCL 5 MG PO TABS: 5.0000 mg | ORAL_TABLET | Freq: Once | ORAL | Status: DC | PRN

## 2015-08-31 MED ORDER — ROSUVASTATIN CALCIUM 10 MG PO TABS
20.0000 mg | ORAL_TABLET | Freq: Every day | ORAL | Status: DC
  Administered 2015-08-31 – 2015-09-01 (×2): 20 mg via ORAL
  Filled 2015-08-31 (×2): qty 2

## 2015-08-31 MED ORDER — WHITE PETROLATUM GEL
Status: AC
  Administered 2015-08-31: 0.2
  Filled 2015-08-31: qty 1

## 2015-08-31 MED ORDER — ALUM & MAG HYDROXIDE-SIMETH 200-200-20 MG/5ML PO SUSP: 30.0000 mL | ORAL | Status: DC | PRN

## 2015-08-31 MED ORDER — FOSINOPRIL SODIUM 20 MG PO TABS: 40.0000 mg | ORAL_TABLET | Freq: Every day | ORAL | Status: DC

## 2015-08-31 MED ORDER — MIDAZOLAM HCL 2 MG/2ML IJ SOLN
INTRAMUSCULAR | Status: AC
  Filled 2015-08-31: qty 2

## 2015-08-31 MED ORDER — LISINOPRIL 40 MG PO TABS
40.0000 mg | ORAL_TABLET | Freq: Once | ORAL | Status: AC
  Administered 2015-08-31: 40 mg via ORAL
  Filled 2015-08-31: qty 1

## 2015-08-31 MED ORDER — PHENYLEPHRINE HCL 10 MG/ML IJ SOLN
INTRAMUSCULAR | Status: DC | PRN
  Administered 2015-08-31: 80 ug via INTRAVENOUS
  Administered 2015-08-31: 40 ug via INTRAVENOUS

## 2015-08-31 MED ORDER — ONDANSETRON HCL 4 MG/2ML IJ SOLN: 4.0000 mg | Freq: Four times a day (QID) | INTRAMUSCULAR | Status: DC | PRN

## 2015-08-31 MED ORDER — LIDOCAINE HCL (CARDIAC) 20 MG/ML IV SOLN
INTRAVENOUS | Status: AC
  Filled 2015-08-31: qty 5

## 2015-08-31 MED ORDER — EPHEDRINE SULFATE 50 MG/ML IJ SOLN
INTRAMUSCULAR | Status: AC
  Filled 2015-08-31: qty 1

## 2015-08-31 MED ORDER — ONDANSETRON HCL 4 MG/2ML IJ SOLN
INTRAMUSCULAR | Status: AC
  Filled 2015-08-31: qty 2

## 2015-08-31 MED ORDER — SUCCINYLCHOLINE CHLORIDE 20 MG/ML IJ SOLN
INTRAMUSCULAR | Status: AC
  Filled 2015-08-31: qty 1

## 2015-08-31 MED ORDER — GLYCOPYRROLATE 0.2 MG/ML IJ SOLN
INTRAMUSCULAR | Status: DC | PRN
  Administered 2015-08-31: 0.6 mg via INTRAVENOUS

## 2015-08-31 MED ORDER — METOCLOPRAMIDE HCL 5 MG/ML IJ SOLN: 5.0000 mg | Freq: Three times a day (TID) | INTRAMUSCULAR | Status: DC | PRN

## 2015-08-31 MED ORDER — POLYETHYLENE GLYCOL 3350 17 G PO PACK: 17.0000 g | PACK | Freq: Every day | ORAL | Status: DC | PRN

## 2015-08-31 SURGICAL SUPPLY — 64 items
BLADE SAW SGTL 83.5X18.5 (BLADE) ×3 IMPLANT
CEMENT BONE DEPUY (Cement) ×3 IMPLANT
COVER SURGICAL LIGHT HANDLE (MISCELLANEOUS) ×3 IMPLANT
DERMABOND ADHESIVE PROPEN (GAUZE/BANDAGES/DRESSINGS) ×2
DERMABOND ADVANCED (GAUZE/BANDAGES/DRESSINGS) ×2
DERMABOND ADVANCED .7 DNX12 (GAUZE/BANDAGES/DRESSINGS) ×1 IMPLANT
DERMABOND ADVANCED .7 DNX6 (GAUZE/BANDAGES/DRESSINGS) ×1 IMPLANT
DRAPE ORTHO SPLIT 77X108 STRL (DRAPES) ×4
DRAPE SURG 17X11 SM STRL (DRAPES) ×3 IMPLANT
DRAPE SURG ORHT 6 SPLT 77X108 (DRAPES) ×2 IMPLANT
DRAPE U-SHAPE 47X51 STRL (DRAPES) ×3 IMPLANT
DRILL BIT 7/64X5 (BIT) IMPLANT
DRSG AQUACEL AG ADV 3.5X10 (GAUZE/BANDAGES/DRESSINGS) ×3 IMPLANT
DURAPREP 26ML APPLICATOR (WOUND CARE) ×3 IMPLANT
ELECT BLADE 4.0 EZ CLEAN MEGAD (MISCELLANEOUS) ×3
ELECT CAUTERY BLADE 6.4 (BLADE) ×3 IMPLANT
ELECT REM PT RETURN 9FT ADLT (ELECTROSURGICAL) ×3
ELECTRODE BLDE 4.0 EZ CLN MEGD (MISCELLANEOUS) ×1 IMPLANT
ELECTRODE REM PT RTRN 9FT ADLT (ELECTROSURGICAL) ×1 IMPLANT
FACESHIELD WRAPAROUND (MASK) ×9 IMPLANT
GLENOID WITH CLEAT SM (Miscellaneous) ×3 IMPLANT
GLOVE BIO SURGEON STRL SZ7.5 (GLOVE) ×3 IMPLANT
GLOVE BIO SURGEON STRL SZ8 (GLOVE) ×3 IMPLANT
GLOVE EUDERMIC 7 POWDERFREE (GLOVE) ×3 IMPLANT
GLOVE SS BIOGEL STRL SZ 7.5 (GLOVE) ×2 IMPLANT
GLOVE SUPERSENSE BIOGEL SZ 7.5 (GLOVE) ×4
GOWN STRL REUS W/ TWL LRG LVL3 (GOWN DISPOSABLE) ×1 IMPLANT
GOWN STRL REUS W/ TWL XL LVL3 (GOWN DISPOSABLE) ×2 IMPLANT
GOWN STRL REUS W/TWL LRG LVL3 (GOWN DISPOSABLE) ×2
GOWN STRL REUS W/TWL XL LVL3 (GOWN DISPOSABLE) ×4
HEAD HUMERAL UNIV 46/20 (Head) ×3 IMPLANT
KIT BASIN OR (CUSTOM PROCEDURE TRAY) ×3 IMPLANT
KIT ROOM TURNOVER OR (KITS) ×3 IMPLANT
KIT SET UNIVERSAL (KITS) ×3 IMPLANT
MANIFOLD NEPTUNE II (INSTRUMENTS) ×3 IMPLANT
NDL SUT 6 .5 CRC .975X.05 MAYO (NEEDLE) ×1 IMPLANT
NEEDLE HYPO 25GX1X1/2 BEV (NEEDLE) IMPLANT
NEEDLE MAYO TAPER (NEEDLE) ×2
NS IRRIG 1000ML POUR BTL (IV SOLUTION) ×3 IMPLANT
PACK SHOULDER (CUSTOM PROCEDURE TRAY) ×3 IMPLANT
PAD ARMBOARD 7.5X6 YLW CONV (MISCELLANEOUS) ×6 IMPLANT
PASSER SUT SWANSON 36MM LOOP (INSTRUMENTS) ×3 IMPLANT
RESTRAINT HEAD UNIVERSAL NS (MISCELLANEOUS) ×3 IMPLANT
SLING ARM FOAM STRAP LRG (SOFTGOODS) ×3 IMPLANT
SLING ARM IMMOBILIZER LRG (SOFTGOODS) ×3 IMPLANT
SLING ARM XL FOAM STRAP (SOFTGOODS) ×3 IMPLANT
SMARTMIX MINI TOWER (MISCELLANEOUS) ×3
SPONGE LAP 18X18 X RAY DECT (DISPOSABLE) IMPLANT
SPONGE LAP 4X18 X RAY DECT (DISPOSABLE) ×3 IMPLANT
STEM HUMERAL APEX UNI 9MM (Stem) ×2 IMPLANT
SUCTION FRAZIER TIP 10 FR DISP (SUCTIONS) ×3 IMPLANT
SUT BONE WAX W31G (SUTURE) IMPLANT
SUT FIBERWIRE #2 38 T-5 BLUE (SUTURE) ×12
SUT MNCRL AB 3-0 PS2 18 (SUTURE) ×3 IMPLANT
SUT VIC AB 1 CT1 27 (SUTURE) ×4
SUT VIC AB 1 CT1 27XBRD ANBCTR (SUTURE) ×2 IMPLANT
SUT VIC AB 2-0 CT1 27 (SUTURE) ×4
SUT VIC AB 2-0 CT1 TAPERPNT 27 (SUTURE) ×2 IMPLANT
SUTURE FIBERWR #2 38 T-5 BLUE (SUTURE) ×4 IMPLANT
SYR CONTROL 10ML LL (SYRINGE) IMPLANT
TOWEL OR 17X24 6PK STRL BLUE (TOWEL DISPOSABLE) ×3 IMPLANT
TOWEL OR 17X26 10 PK STRL BLUE (TOWEL DISPOSABLE) ×3 IMPLANT
TOWER SMARTMIX MINI (MISCELLANEOUS) ×1 IMPLANT
WATER STERILE IRR 1000ML POUR (IV SOLUTION) IMPLANT

## 2015-08-31 NOTE — Transfer of Care (Signed)
Immediate Anesthesia Transfer of Care Note  Patient: Regina Rhodes  Procedure(s) Performed: Procedure(s): RIGHT TOTAL SHOULDER ARTHROPLASTY (Right)  Patient Location: PACU  Anesthesia Type:General and Regional  Level of Consciousness: awake, alert , oriented and sedated  Airway & Oxygen Therapy: Patient Spontanous Breathing and Patient connected to face mask oxygen  Post-op Assessment: Report given to RN, Post -op Vital signs reviewed and stable and Patient moving all extremities  Post vital signs: Reviewed and stable  Last Vitals:  Filed Vitals:   08/31/15 0653 08/31/15 0711  BP: 225/74 194/88  Pulse:    Temp:    Resp:      Complications: No apparent anesthesia complications

## 2015-08-31 NOTE — Discharge Instructions (Signed)

## 2015-08-31 NOTE — H&P (Signed)
Gentry FitzElisabeth Hoffmaster    Chief Complaint: OA OF RIGHT SHOULDER HPI: The patient is a 70 y.o. female with end stage right shoulder OA  Past Medical History  Diagnosis Date  . Hypertension   . Hyperlipemia   . Fibromyalgia   . Heart murmur     'functional heart murmur"  . Bronchitis     hasn't had any in over 8 yrs  . Arthritis     Past Surgical History  Procedure Laterality Date  . Replacement total knee bilateral Bilateral 1990, 2004  . Total hip arthroplasty Left 2007  . Cholecystectomy  1999  . Lumbar fusion  2004    L4 L5  . X-stop implantation  2005  . Joint replacement Bilateral     knee  . Back surgery      for stenosis  . Appendectomy    . Total shoulder arthroplasty Left 08/05/2013    Procedure: LEFT TOTAL SHOULDER ARTHROPLASTY;  Surgeon: Senaida LangeKevin M Alyannah Sanks, MD;  Location: [facility] OR;  Service: Orthopedics;  Laterality: Left;    Family History  Problem Relation Age of Onset  . Heart attack Father 3276    first at age 239  . Hypertension Father   . Hypertension Mother   . Hypertension Brother   . Hyperlipidemia Brother     Social History:  reports that she has never smoked. She does not have any smokeless tobacco history on file. She reports that she drinks about 8.4 oz of alcohol per week. She reports that she does not use illicit drugs.  Allergies: No Known Allergies  Medications Prior to Admission  Medication Sig Dispense Refill  . DULoxetine (CYMBALTA) 60 MG capsule Take 60 mg by mouth daily.    Marland Kitchen. ezetimibe (ZETIA) 10 MG tablet Take 10 mg by mouth daily.    . fosinopril (MONOPRIL) 40 MG tablet Take 40 mg by mouth daily.    . Naproxen Sodium (ALEVE PO) Take 440 mg by mouth daily as needed (for shoulder pain).     . rosuvastatin (CRESTOR) 20 MG tablet Take 20 mg by mouth daily.       Physical Exam: right shoulder with painful and restricted motion as noted at recent office visits  Vitals  Temp:  [97.5 F (36.4 C)] 97.5 F (36.4 C) (01/05 0606) Pulse Rate:   [65] 65 (01/05 0606) Resp:  [16] 16 (01/05 0606) BP: (194-225)/(74-88) 194/88 mmHg (01/05 0711) SpO2:  [100 %] 100 % (01/05 0606) Weight:  [93.441 kg (206 lb)] 93.441 kg (206 lb) (01/05 0606)  Assessment/Plan  Impression: OA OF RIGHT SHOULDER  Plan of Action: Procedure(s): RIGHT TOTAL SHOULDER ARTHROPLASTY  Zoeie Ritter M Dakwan Pridgen 08/31/2015, 7:16 AM Contact # [phone]

## 2015-08-31 NOTE — Anesthesia Preprocedure Evaluation (Signed)
Anesthesia Evaluation  Patient identified by MRN, date of birth, ID band Patient awake    Reviewed: Allergy & Precautions, NPO status , Patient's Chart, lab work & pertinent test results  Airway Mallampati: III  TM Distance: <3 FB Neck ROM: Full    Dental  (+) Dental Advisory Given   Pulmonary neg pulmonary ROS,    breath sounds clear to auscultation       Cardiovascular hypertension, Pt. on medications  Rhythm:Regular Rate:Normal  Hypertensive on arrival today. ACEi given and BP down to 166/66 preoperatively. Pt denies CP/ SOB.   Neuro/Psych negative neurological ROS     GI/Hepatic negative GI ROS, Neg liver ROS,   Endo/Other  Morbid obesity  Renal/GU negative Renal ROS     Musculoskeletal  (+) Arthritis ,   Abdominal   Peds  Hematology negative hematology ROS (+)   Anesthesia Other Findings   Reproductive/Obstetrics                             Lab Results  Component Value Date   WBC 5.8 08/23/2015   HGB 13.9 08/23/2015   HCT 43.0 08/23/2015   MCV 98.4 08/23/2015   PLT 210 08/23/2015   Lab Results  Component Value Date   CREATININE 0.72 08/23/2015   BUN 12 08/23/2015   NA 141 08/23/2015   K 4.3 08/23/2015   CL 105 08/23/2015   CO2 30 08/23/2015    Anesthesia Physical Anesthesia Plan  ASA: III  Anesthesia Plan: General and Regional   Post-op Pain Management: GA combined w/ Regional for post-op pain   Induction: Intravenous  Airway Management Planned: Oral ETT  Additional Equipment:   Intra-op Plan:   Post-operative Plan: Extubation in OR  Informed Consent: I have reviewed the patients History and Physical, chart, labs and discussed the procedure including the risks, benefits and alternatives for the proposed anesthesia with the patient or authorized representative who has indicated his/her understanding and acceptance.   Dental advisory given  Plan Discussed  with: CRNA  Anesthesia Plan Comments:         Anesthesia Quick Evaluation

## 2015-08-31 NOTE — Anesthesia Postprocedure Evaluation (Signed)
Anesthesia Post Note  Patient: Regina Rhodes  Procedure(s) Performed: Procedure(s) (LRB): RIGHT TOTAL SHOULDER ARTHROPLASTY (Right)  Patient location during evaluation: PACU Anesthesia Type: General and Regional Level of consciousness: awake and alert Pain management: pain level controlled Vital Signs Assessment: post-procedure vital signs reviewed and stable Respiratory status: spontaneous breathing Cardiovascular status: blood pressure returned to baseline Anesthetic complications: no    Last Vitals:  Filed Vitals:   08/31/15 1100 08/31/15 1115  BP: 140/67 133/75  Pulse: 69 71  Temp:    Resp: 16 20    Last Pain:  Filed Vitals:   08/31/15 1120  PainSc: 0-No pain                 Kennieth RadFitzgerald, [name] E

## 2015-08-31 NOTE — Anesthesia Procedure Notes (Addendum)
Anesthesia Regional Block:  Interscalene brachial plexus block  Pre-Anesthetic Checklist: ,, timeout performed, Correct Patient, Correct Site, Correct Laterality, Correct Procedure, Correct Position, site marked, Risks and benefits discussed,  Surgical consent,  Pre-op evaluation,  At surgeon's request and post-op pain management  Laterality: Right  Prep: chloraprep       Needles:  Injection technique: Single-shot  Needle Type: Echogenic Stimulator Needle     Needle Length: 9cm 9 cm Needle Gauge: 21 and 21 G    Additional Needles:  Procedures: ultrasound guided (picture in chart) and nerve stimulator Interscalene brachial plexus block  Nerve Stimulator or Paresthesia:  Response: deltoid and biceps, 0.5 mA,   Additional Responses:   Narrative:  Injection made incrementally with aspirations every 5 mL.  Performed by: Personally  Anesthesiologist: Marcene DuosFITZGERALD, ROBERT  Additional Notes: Risks and benefits discussed. Pt tolerated well with no immediate complications.   Procedure Name: Intubation Date/Time: 08/31/2015 7:50 AM Performed by: Fransisca KaufmannMEYER, Yasmen Cortner E Pre-anesthesia Checklist: Patient identified, Emergency Drugs available, Suction available, Patient being monitored and Timeout performed Patient Re-evaluated:Patient Re-evaluated prior to inductionOxygen Delivery Method: Circle system utilized Preoxygenation: Pre-oxygenation with 100% oxygen Intubation Type: IV induction Ventilation: Mask ventilation without difficulty and Oral airway inserted - appropriate to patient size Laryngoscope Size: Hyacinth MeekerMiller and 2 Grade View: Grade III Tube type: Oral Tube size: 7.5 mm Number of attempts: 1 Airway Equipment and Method: Stylet Placement Confirmation: ETT inserted through vocal cords under direct vision,  positive ETCO2 and breath sounds checked- equal and bilateral Secured at: 21 cm Tube secured with: Tape Dental Injury: Teeth and Oropharynx as per pre-operative assessment    Difficulty Due To: Difficulty was anticipated

## 2015-08-31 NOTE — Addendum Note (Signed)
Addendum  created 08/31/15 1210 by Fransisca KaufmannMary E Omarr Hann, CRNA   Modules edited: Charges VN

## 2015-08-31 NOTE — Progress Notes (Signed)
Arrived to the unit via bed from PACU at 1245. Pt A&O x4; right shoulder incision dsg clean, dry and intact, no stain or active bleeding noted. VSS, pt oriented to the unit and room. IV intact with fluid infusing. Pt denies any pain. Pt voided upon arrival to the unit. Reported off to oncoming RN. Dionne BucyP. Amo Mohit Zirbes RN

## 2015-08-31 NOTE — Op Note (Signed)
08/31/2015  9:34 AM  PATIENT:   Argie RammingElisabeth Davenport  70 y.o. female  PRE-OPERATIVE DIAGNOSIS:  END STAGE OA OF RIGHT SHOULDER  POST-OPERATIVE DIAGNOSIS:  same  PROCEDURE:  R TSA #9 arthrex stem, 46x20 head, small glenoid  SURGEON:  Stevens Magwood, Vania ReaKevin M. M.D.  ASSISTANTS: Shuford pac   ANESTHESIA:   GET + ISB  EBL: 200  SPECIMEN:  none  Drains: none   PATIENT DISPOSITION:  PACU - hemodynamically stable.    PLAN OF CARE: Admit for overnight observation  Dictation# [id number]   Contact # [phone]

## 2015-08-31 NOTE — Progress Notes (Signed)
Call to Pharm. For  BP med. Order, it will be tubed asap.

## 2015-08-31 NOTE — Progress Notes (Signed)
BP elevated this a.m. , Dr. Glade Stanford. Fitzgerald aware.

## 2015-09-01 ENCOUNTER — Encounter (HOSPITAL_COMMUNITY): Payer: Self-pay | Admitting: Orthopedic Surgery

## 2015-09-01 DIAGNOSIS — M19011 Primary osteoarthritis, right shoulder: Secondary | ICD-10-CM | POA: Diagnosis not present

## 2015-09-01 MED ORDER — OXYCODONE-ACETAMINOPHEN 5-325 MG PO TABS: 1.0000 | ORAL_TABLET | ORAL | Status: DC | PRN

## 2015-09-01 NOTE — Evaluation (Addendum)
Physical Therapy Evaluation/ Discharge Patient Details Name: Regina Rhodes MRN: [id number] DOB: 05-15-1946 Today's Date: 09/01/2015   History of Present Illness  Pt admitted for Right total shoulder replacement. PMHx: left total shoulder 12/14, bil TKA, L THA, lumbar fusion, HTN, HLD  Clinical Impression  Pt very pleasant and moving well. Pt familiar with shoulder restrictions from prior Left shoulder. Pt with assist of niece at D/C and will benefit from OPOT. Pt sitting in chair on arrival, able to perform gait and all transfers without assist or LOB. Currently no further P.T. Needs and will defer to OT, pt aware and agreeable.    Follow Up Recommendations No PT follow up    Equipment Recommendations  None recommended by PT    Recommendations for Other Services OT consult     Precautions / Restrictions Precautions Precautions: Shoulder Restrictions RUE Weight Bearing: Non weight bearing      Mobility  Bed Mobility Overal bed mobility: Modified Independent                Transfers Overall transfer level: Modified independent                  Ambulation/Gait Ambulation/Gait assistance: Independent Ambulation Distance (Feet): 300 Feet Assistive device: None Gait Pattern/deviations: WFL(Within Functional Limits)   Gait velocity interpretation: at or above normal speed for age/gender General Gait Details: including head turns with no LOB, no falls in last year  Stairs            Wheelchair Mobility    Modified Rankin (Stroke Patients Only)       Balance                                             Pertinent Vitals/Pain Pain Assessment: 0-10 Pain Score: 5  Pain Location: right shoulder Pain Descriptors / Indicators: Aching Pain Intervention(s): Limited activity within patient's tolerance;Monitored during session;Repositioned;Patient requesting pain meds-RN notified    Home Living Family/patient expects to be  discharged to:: Private residence Living Arrangements: Alone Available Help at Discharge: Family;Available 24 hours/day Type of Home: House Home Access: Level entry     Home Layout: One level Home Equipment: Hand held shower head;Grab bars - tub/shower      Prior Function Level of Independence: Independent         Comments: pt enjoys water aerobics and spends half the year in [place]     Hand Dominance        Extremity/Trunk Assessment   Upper Extremity Assessment: Defer to OT evaluation           Lower Extremity Assessment: Overall WFL for tasks assessed      Cervical / Trunk Assessment: Normal  Communication   Communication: No difficulties  Cognition Arousal/Alertness: Awake/alert Behavior During Therapy: WFL for tasks assessed/performed Overall Cognitive Status: Within Functional Limits for tasks assessed                      General Comments      Exercises        Assessment/Plan    PT Assessment Patent does not need any further PT services  PT Diagnosis Acute pain   PT Problem List    PT Treatment Interventions     PT Goals (Current goals can be found in the Care Plan section) Acute Rehab PT Goals PT Goal Formulation: All  assessment and education complete, DC therapy    Frequency     Barriers to discharge        Co-evaluation               End of Session Equipment Utilized During Treatment: Gait belt;Other (comment) (RUE sling) Activity Tolerance: Patient tolerated treatment well Patient left: in chair;with call bell/phone within reach Nurse Communication: Mobility status;Precautions    Functional Assessment Tool Used: clinical judgement Functional Limitation: Mobility: Walking and moving around Mobility: Walking and Moving Around Current Status ([id number](G8978): At least 1 percent but less than 20 percent impaired, limited or restricted Mobility: Walking and Moving Around Goal Status [phone](G8979): At least 1 percent but less  than 20 percent impaired, limited or restricted Mobility: Walking and Moving Around Discharge Status [phone](G8980): At least 1 percent but less than 20 percent impaired, limited or restricted    Time: 0822-0834 PT Time Calculation (min) (ACUTE ONLY): 12 min   Charges:   PT Evaluation $PT Eval Low Complexity: 1 Procedure     PT G Codes:   PT G-Codes **NOT FOR INPATIENT CLASS** Functional Assessment Tool Used: clinical judgement Functional Limitation: Mobility: Walking and moving around Mobility: Walking and Moving Around Current Status ([phone](G8978): At least 1 percent but less than 20 percent impaired, limited or restricted Mobility: Walking and Moving Around Goal Status [phone](G8979): At least 1 percent but less than 20 percent impaired, limited or restricted Mobility: Walking and Moving Around Discharge Status [phone](G8980): At least 1 percent but less than 20 percent impaired, limited or restricted    Delorse Lekabor, Rie Mcneil Beth 09/01/2015, 9:17 AM Delaney MeigsMaija Tabor Kamariah Fruchter, PT [phone]

## 2015-09-01 NOTE — Discharge Summary (Signed)
PATIENT ID:      Regina Rhodes  MRN:     [id number] DOB/AGE:    1946/02/02 / 70 y.o.     DISCHARGE SUMMARY  ADMISSION DATE:    08/31/2015 DISCHARGE DATE:    ADMISSION DIAGNOSIS: OA OF RIGHT SHOULDER Past Medical History  Diagnosis Date  . Hypertension   . Hyperlipemia   . Fibromyalgia   . Heart murmur     'functional heart murmur"  . Bronchitis     hasn't had any in over 8 yrs  . Arthritis     DISCHARGE DIAGNOSIS:   Active Problems:   S/P shoulder replacement   PROCEDURE: Procedure(s): RIGHT TOTAL SHOULDER ARTHROPLASTY on 08/31/2015  CONSULTS:   none  HISTORY:  See H&P in chart.  HOSPITAL COURSE:  Regina Rhodes is a 70 y.o. admitted on 08/31/2015 with a chief complaint of right shoulder pain and dysfunction, and found to have a diagnosis of OA OF RIGHT SHOULDER.  They were brought to the operating room on 08/31/2015 and underwent Procedure(s): RIGHT TOTAL SHOULDER ARTHROPLASTY.    They were given perioperative antibiotics: Anti-infectives    Start     Dose/Rate Route Frequency Ordered Stop   08/31/15 1400  ceFAZolin (ANCEF) IVPB 2 g/50 mL premix     2 g 100 mL/hr over 30 Minutes Intravenous Every 6 hours 08/31/15 1250 09/01/15 0759   08/31/15 0700  ceFAZolin (ANCEF) IVPB 2 g/50 mL premix     2 g 100 mL/hr over 30 Minutes Intravenous To [facility] 08/30/15 1307 08/31/15 0751    .  Patient underwent the above named procedure and tolerated it well. The following day they were hemodynamically stable and pain was controlled on oral analgesics. They were neurovascularly intact to the operative extremity. OT was ordered and worked with patient per protocol. They were medically and orthopaedically stable for discharge on day 1. HH services were arranged.    DIAGNOSTIC STUDIES:  RECENT RADIOGRAPHIC STUDIES :  No results found.  RECENT VITAL SIGNS:  Patient Vitals for the past 24 hrs:  BP Temp Temp src Pulse Resp SpO2  09/01/15 0500 (!) 107/44 mmHg 98.3 F  (36.8 C) Oral 69 20 91 %  08/31/15 2100 135/84 mmHg 98.8 F (37.1 C) Oral 81 20 93 %  08/31/15 1245 139/62 mmHg 98.2 F (36.8 C) Oral 71 18 95 %  08/31/15 1222 138/66 mmHg 97.8 F (36.6 C) - 69 18 95 %  08/31/15 1200 (!) 148/72 mmHg - - 68 18 96 %  08/31/15 1130 127/74 mmHg - - 65 17 95 %  08/31/15 1115 133/75 mmHg - - 71 20 94 %  08/31/15 1100 140/67 mmHg - - 69 16 98 %  08/31/15 1045 (!) 150/70 mmHg - - 72 17 98 %  08/31/15 1030 (!) 154/65 mmHg - - 68 17 98 %  08/31/15 1015 (!) 166/73 mmHg 97 F (36.1 C) - 74 17 97 %  .  RECENT EKG RESULTS:    Orders placed or performed during the hospital encounter of 08/23/15  . EKG 12-Lead  . EKG 12-Lead    DISCHARGE INSTRUCTIONS:  Discharge Instructions    Discontinue IV    Complete by:  As directed            DISCHARGE MEDICATIONS:     Medication List    TAKE these medications        ALEVE PO  Take 440 mg by mouth daily as needed (for shoulder pain).  DULoxetine 60 MG capsule  Commonly known as:  CYMBALTA  Take 60 mg by mouth daily.     ezetimibe 10 MG tablet  Commonly known as:  ZETIA  Take 10 mg by mouth daily.     fosinopril 40 MG tablet  Commonly known as:  MONOPRIL  Take 40 mg by mouth daily.     oxyCODONE-acetaminophen 5-325 MG tablet  Commonly known as:  PERCOCET  Take 1-2 tablets by mouth every 4 (four) hours as needed.     rosuvastatin 20 MG tablet  Commonly known as:  CRESTOR  Take 20 mg by mouth daily.        FOLLOW UP VISIT:       Follow-up Information    Follow up with Regina ReaKEVIN M SUPPLE, MD.   Specialty:  Orthopedic Surgery   Why:  call to be seen in 10-14 days   Contact information:   [address] [place] [address] [phone]       DISCHARGE TO: Home   DISPOSITION: Good   DISCHARGE CONDITION:  Regina BongGood   Regina Rhodes for Dr. Francena HanlyKevin Rhodes 09/01/2015, 7:38 AM

## 2015-09-01 NOTE — Evaluation (Signed)
Occupational Therapy Evaluation Patient Details Name: Regina Rhodes MRN: [id number] DOB: 1945/09/12 Today's Date: 09/01/2015    History of Present Illness Pt admitted for Right total shoulder replacement. 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 Currently pt is overall supervision for functional mobility and min assist for ADLs. All safety, ADL, and shoulder education complete; pt with no further questions or concerns for OT at this time. Pt planning to d/c home with 24/7 supervision from her niece. Pt ready to d/c from OT standpoint; signing off at this time. Thank you for this referral.     Follow Up Recommendations  Supervision - Intermittent (follow up per MD)    Equipment Recommendations  None recommended by OT    Recommendations for Other Services       Precautions / Restrictions Precautions Precautions: Shoulder Type of Shoulder Precautions: Passive protocol: AROM elbow, wrist, hand OK. PROM FF 90, ABD 60, ER 30. Pendulums OK. Shoulder Interventions: Shoulder sling/immobilizer;At all times;Off for dressing/bathing/exercises Precaution Booklet Issued: Yes (comment) Precaution Comments: Reviewed precautions with pt Required Braces or Orthoses: Sling Restrictions Weight Bearing Restrictions: Yes RUE Weight Bearing: Non weight bearing      Mobility Bed Mobility Overal bed mobility: Modified Independent                Transfers Overall transfer level: Needs assistance Equipment used: None Transfers: Sit to/from Stand Sit to Stand: Supervision         General transfer comment: Distant supervision for safety.    Balance Overall balance assessment: No apparent balance deficits (not formally assessed)                                          ADL Overall ADL's : Needs assistance/impaired Eating/Feeding: Set up;Sitting   Grooming:  Supervision/safety;Standing;Wash/dry hands   Upper Body Bathing: Minimal assitance;Sitting Upper Body Bathing Details (indicate cue type and reason): Educated on UB bathing technique. Lower Body Bathing: Minimal assistance;Sit to/from stand   Upper Body Dressing : Minimal assistance;Standing Upper Body Dressing Details (indicate cue type and reason): Educated on UB dressing technique; able to return demo. Pt required min assist for threading RUE into clothing. Lower Body Dressing: Minimal assistance;Sit to/from stand Lower Body Dressing Details (indicate cue type and reason): Pt able to don underwear independently, required assist with socks and shoes. Pt able to start pants over feet but had difficulty pulling them up in standing.  Toilet Transfer: Supervision/safety;Ambulation Toilet Transfer Details (indicate cue type and reason): Pt supervision for safety with toilet transfer with no use of grab bars to simulate home environment. Toileting- Clothing Manipulation and Hygiene: Supervision/safety;Sit to/from stand       Functional mobility during ADLs: Supervision/safety General ADL Comments: No family present for OT eval. Educated on home safety, ice for edema and pain, sling management and wear schedule, RUE positioning; pt verbalized understanding.     Vision     Perception     Praxis      Pertinent Vitals/Pain Pain Assessment: 0-10 Pain Score: 5  Pain Location: R shoulder Pain Descriptors / Indicators: Aching;Sore Pain Intervention(s): Limited activity within patient's tolerance;Monitored during session;Repositioned;RN gave pain meds during session;Ice applied     Hand Dominance Right   Extremity/Trunk Assessment Upper Extremity Assessment Upper Extremity Assessment: RUE deficits/detail RUE  Deficits / Details: Pt is s/p shoulder sx, shoulder ROM deficits to be expected. AROM elbow, wrist, hand WFL. Strength overall WFL. RUE: Unable to fully assess due to pain;Unable to  fully assess due to immobilization   Lower Extremity Assessment Lower Extremity Assessment: Overall WFL for tasks assessed   Cervical / Trunk Assessment Cervical / Trunk Assessment: Normal   Communication Communication Communication: No difficulties   Cognition Arousal/Alertness: Awake/alert Behavior During Therapy: WFL for tasks assessed/performed Overall Cognitive Status: Within Functional Limits for tasks assessed                     General Comments       Exercises Exercises: Shoulder     Shoulder Instructions Shoulder Instructions Donning/doffing shirt without moving shoulder: Minimal assistance;Patient able to independently direct caregiver (educated) Method for sponge bathing under operated UE: Minimal assistance;Patient able to independently direct caregiver (educated) Donning/doffing sling/immobilizer: Minimal assistance;Patient able to independently direct caregiver (educated) Correct positioning of sling/immobilizer: Modified independent (educated) ROM for elbow, wrist and digits of operated UE: Modified independent (educated) Sling wearing schedule (on at all times/off for ADL's): Modified independent (educated) Proper positioning of operated UE when showering: Supervision/safety;Patient able to independently direct caregiver (educated) Positioning of UE while sleeping: Minimal assistance;Patient able to independently direct caregiver (educated)    Home Living Family/patient expects to be discharged to:: Private residence Living Arrangements: Alone Available Help at Discharge: Family;Available 24 hours/day (Niece coming to stay with pt) Type of Home: House Home Access: Level entry     Home Layout: One level     Bathroom Shower/Tub: Walk-in shower;Door   [employer] Toilet: Standard     Home Equipment: Hand held shower head;Grab bars - tub/shower          Prior Functioning/Environment Level of Independence: Independent        Comments: pt  enjoys water aerobics and spends half the year in [place]    OT Diagnosis: Acute pain   OT Problem List:     OT Treatment/Interventions:      OT Goals(Current goals can be found in the care plan section) Acute Rehab OT Goals OT Goal Formulation: All assessment and education complete, DC therapy  OT Frequency:     Barriers to D/C:            Co-evaluation              End of Session Equipment Utilized During Treatment: Other (comment) (sling) Nurse Communication: Other (comment) (pt ready for d/c from OT standpoint)  Activity Tolerance: Patient tolerated treatment well Patient left: in chair;with call bell/phone within reach   Time: 0833-0912 OT Time Calculation (min): 39 min Charges:  OT General Charges $OT Visit: 1 Procedure OT Evaluation $OT Eval Low Complexity: 1 Procedure OT Treatments $Self Care/Home Management : 8-22 mins $Therapeutic Exercise: 8-22 mins G-Codes: OT G-codes **NOT FOR INPATIENT CLASS** Functional Assessment Tool Used: Clinical judgement Functional Limitation: Self care Self Care Current Status (Z6109): At least 1 percent but less than 20 percent impaired, limited or restricted Self Care Goal Status (U0454): At least 1 percent but less than 20 percent impaired, limited or restricted Self Care Discharge Status [phone]): At least 1 percent but less than 20 percent impaired, limited or restricted   Gaye Alken M.S., OTR/L Pager: [phone]  09/01/2015, 10:39 AM

## 2015-09-01 NOTE — Op Note (Signed)
Regina Rhodes, Regina Rhodes NO.:  [id number]  MEDICAL RECORD NO.:  [id number]  LOCATION:  [facility]                         FACILITY:  [facility]  PHYSICIAN:  Vania Rea. Brain Honeycutt, M.D.  DATE OF BIRTH:  03/26/1946  DATE OF PROCEDURE:  08/31/2015 DATE OF DISCHARGE:                              OPERATIVE REPORT   PREOPERATIVE DIAGNOSIS:  End-stage right shoulder osteoarthritis.  POSTOPERATIVE DIAGNOSIS:  End-stage right shoulder osteoarthritis.  PROCEDURE:  Right total shoulder arthroplasty utilizing a press-fit size 9 Arthrex stem with a 46 x 20 humeral head and a cemented small glenoid.  SURGEON:  Senaida Lange, M.D.  ASSISTANT:  Eloy End, PA-C  ANESTHESIA:  General endotracheal as well as an interscalene block.  ESTIMATED BLOOD LOSS:  200 cc.  DRAINS:  None.  HISTORY:  Regina Rhodes is a 70 year old female, who has had progressively increasing right shoulder pain, loss of mobility, and functional limitations related to end-stage osteoarthritis.  Plain radiographs confirmed marked deformity of the humeral heads, subchondral sclerosis, and peripheral osteophyte formation.  Due to her increasing pain and functional limitations, she is brought to the operating room at this time for planned right total shoulder arthroplasty.  Preoperatively, I counseled Regina Rhodes regarding treatment options and potential risks versus benefits thereof.  Possible surgical complications were all reviewed including bleeding, infection, neurovascular injury, persistent pain, loss of motion, anesthetic complication, failure of the implant, possible need for additional surgery.  She understands and accepts and agrees with our planned procedure.  PROCEDURE IN DETAIL:  After undergoing routine preop evaluation, the patient received prophylactic antibiotics.  An interscalene block was established in the holding area by the Anesthesia Department.  Placed supine on the op table, underwent smooth  induction of general endotracheal anesthesia.  Placed in beach chair position and appropriately padded and protected.  The right shoulder girdle region was sterilely prepped and draped in standard fashion.  Time out was called.  An anterior deltopectoral approach of right shoulder was made through a 10-cm incision.  Skin flaps were elevated.  Electrocautery was used for hemostasis.  Dissection carried deeply.  Deltopectoral interval was identified with cephalic vein taken laterally with the deltoid. Upper centimeter and half of pec major was tenotomized to enhance exposure.  Adhesions were divided.  Interval was developed.  Conjoint tendon was identified, mobilized, and retracted medially.  The biceps tendon was identified and tenotomized for later tenodesis.  We then unroofed proximally, and the rotator cuff was split along the rotator interval at the base of the coracoid.  We divided the subscap away from the lesser tuberosity leaving a 1-cm cuff of tissue for later repair. The free margin of the subscapularis was tagged with a series of #2 FiberWire sutures using a grasping stitch.  We then divided the capsular attachments from the anteroinferior, inferior, and posteroinferior aspects of the humeral head and neck to allow complete delivery of the humeral head through the wound.  There were very large osteophytes in the anterior and inferior aspects of the humeral head.  We used our extramedullary guide to outline our proposed humeral head resection at approximately 30 degrees of retroversion with an oscillating saw.  Once this was completed, we did carefully  protect the rotator cuff during the excision.  We then used a rongeur to remove the prominent osteophytes on the rim of the humeral head anteriorly and inferiorly.  We then hand reamed up to size 7.  We broached up to size 9, which had excellent fit. A size 8 trial stem with a metal cap was then placed into the humeral canal.   Then, we turned our attention to the glenoid.  I first performed a circumferential release of the subscapularis to confirm complete mobility.  I then performed a circumferential labral resection removing the residual proximal stump of the long head of biceps and gained circumferential exposure of the glenoid.  Guidepin was then placed into the center of the glenoid, and the size small showed the best fit.  We reamed with a small reamer to remove residual bone and soft tissue at the margins of the glenoid, placed our central drill hole.  Then, used the guide to place our superior and inferior peg and slot respectively. The trial glenoid showed excellent fit.  The glenoid was then copiously irrigated.  Cement was mixed, introduced into the superior and inferior hole and slot respectively.  The final small glenoid was impacted into position with excellent fit and fixation.  At this point, we turned our attention to the humeral metaphysis.  We impacted our size 9 humeral stem to the appropriate level with excellent fit and fixation.  We then tightened the trunnion in a standard fashion achieving excellent fit and fixation.  We then performed a series of trial reductions, ultimately felt that the 46 x 20 eccentric head showed the best soft tissue balance.  The final 46 x 20 head was impacted after the [facility] taper was carefully cleaned and dried.  Final reduction was then performed after the head was impacted.  We were very pleased with her overall construct and soft tissue balance.  The wound was then copiously irrigated. Hemostasis was obtained.  The subscapularis was then repaired back to the lesser tuberosity through the soft tissue cuff using the #2 FiberWires.  The rotator interval was closed with a figure-of-eight FiberWire as well.  The long head of biceps was tenodesed to the upper border of the pec major with a #2 FiberWire.  The deltopectoral interval was then reapproximated with a  series of #1 figure-of-eight Vicryl sutures.  2-0 Monocryl was used for subcu layer and intracuticular 3-0 Monocryl for the skin followed by Steri-Strips.  Dermabond and dry dressing were then applied in the form of Aquacel.  The right arm was placed into a sling.  The patient was then awakened, extubated, and taken to recovery room in stable condition.  Eloy Endracy Shu, PA-C was used as an Geophysicist/field seismologistassistant throughout this case, essential for help with positioning the patient, positioning the extremity, tissue manipulation, implantation of the prosthesis, wound closure, and intraoperative decision making.     Vania ReaKevin M. Tamon Parkerson, M.D.     KMS/MEDQ  D:  08/31/2015  T:  08/31/2015  Job:  [id number]

## 2015-09-01 NOTE — Care Management Note (Signed)
Case Management Note  Patient Details  Name: Regina Rhodes MRN: [id number] Date of Birth: July 30, 1946  Subjective/Objective:       S/p right shoulder arthroplasty             Action/Plan: Spoke with patient about home care, she selected [facility] . Contacted [facility] at [facility] and set up HHPT and HHOT. No equipment needs identified. Patient states that she will have family available to assist her after discharge.     Expected Discharge Date:                  Expected Discharge Plan:  Home w Home Health Services  In-House Referral:  NA  Discharge planning Services  CM Consult  Post Acute Care Choice:  Home Health Choice offered to:  Patient  DME Arranged:    DME Agency:     HH Arranged:  OT, PT HH Agency:  [facility]  Status of Service:  Completed, signed off  Medicare Important Message Given:    Date Medicare IM Given:    Medicare IM give by:    Date Additional Medicare IM Given:    Additional Medicare Important Message give by:     If discussed at Long Length of Stay Meetings, dates discussed:    Additional Comments:  [name], [name], RN 09/01/2015, 11:01 AM

## 2015-09-13 ENCOUNTER — Encounter (HOSPITAL_COMMUNITY): Payer: Self-pay | Admitting: Orthopedic Surgery

## 2016-06-19 ENCOUNTER — Other Ambulatory Visit: Payer: Self-pay | Admitting: Obstetrics & Gynecology

## 2016-06-19 DIAGNOSIS — Z8041 Family history of malignant neoplasm of ovary: Secondary | ICD-10-CM

## 2016-07-02 ENCOUNTER — Ambulatory Visit
Admission: RE | Admit: 2016-07-02 | Discharge: 2016-07-02 | Disposition: A | Discharge status: Home / Self Care | Payer: PRIVATE HEALTH INSURANCE | Source: Ambulatory Visit | Attending: Obstetrics & Gynecology | Admitting: Obstetrics & Gynecology

## 2016-07-02 DIAGNOSIS — Z8041 Family history of malignant neoplasm of ovary: Secondary | ICD-10-CM

## 2017-11-08 IMAGING — US US TRANSVAGINAL NON-OB
1 series · 14 of 25 positions shown · non-contrast
Comparison: None

CLINICAL DATA: Family history ovarian cancer



[Series 1: us transvaginal non-ob · 0.33mm/px · 14 of 58 slices shown]
[im 1/58]
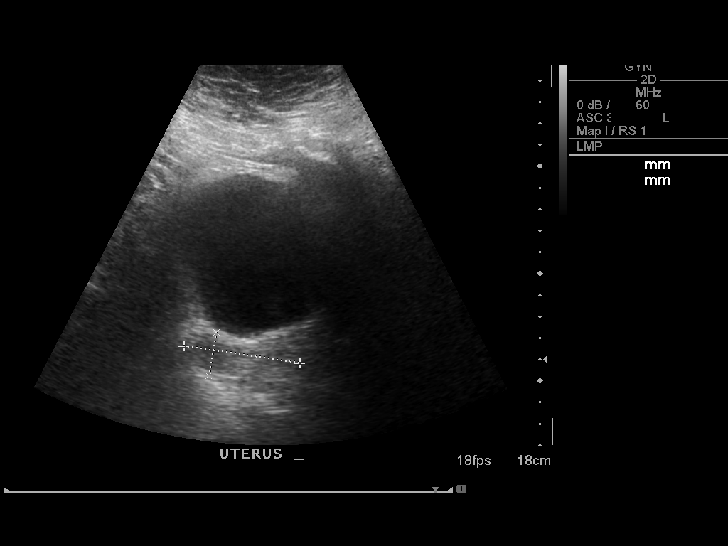
[im 5/58]
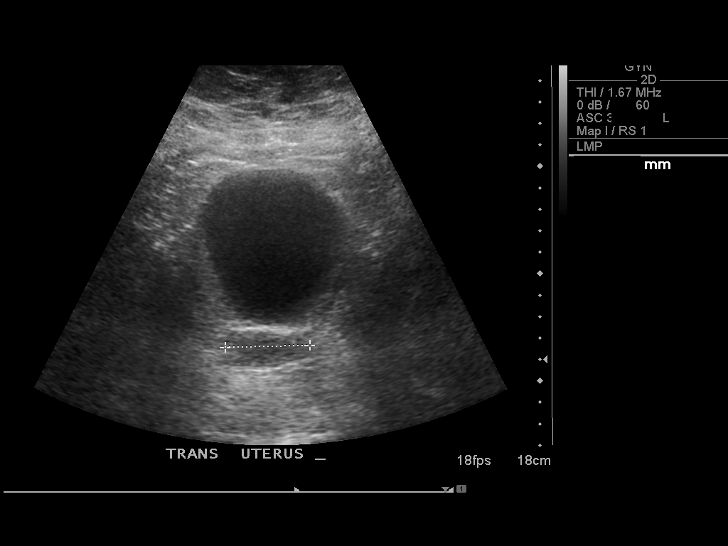
[im 10/58]
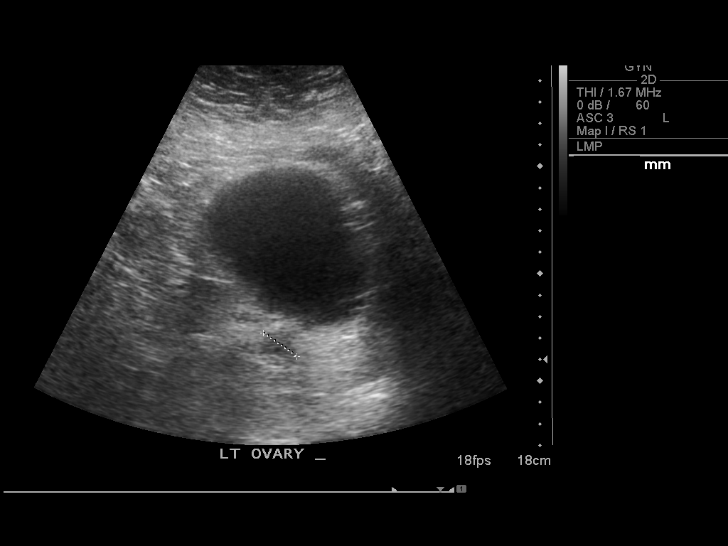
[im 15/58]
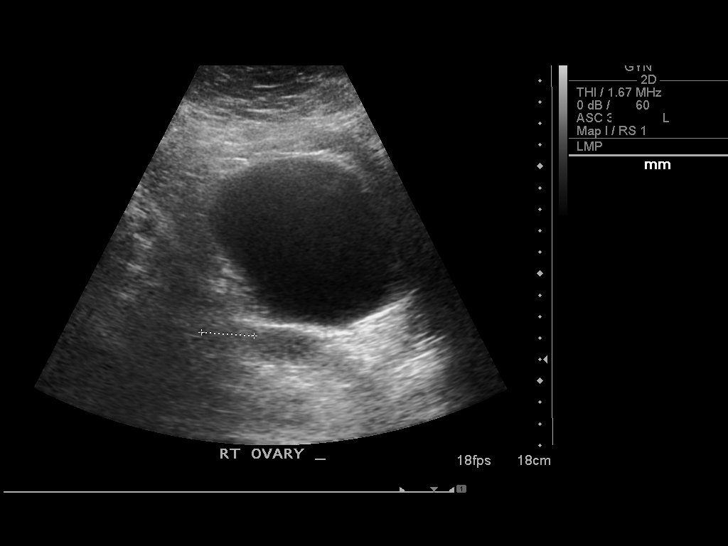
[im 20/58]
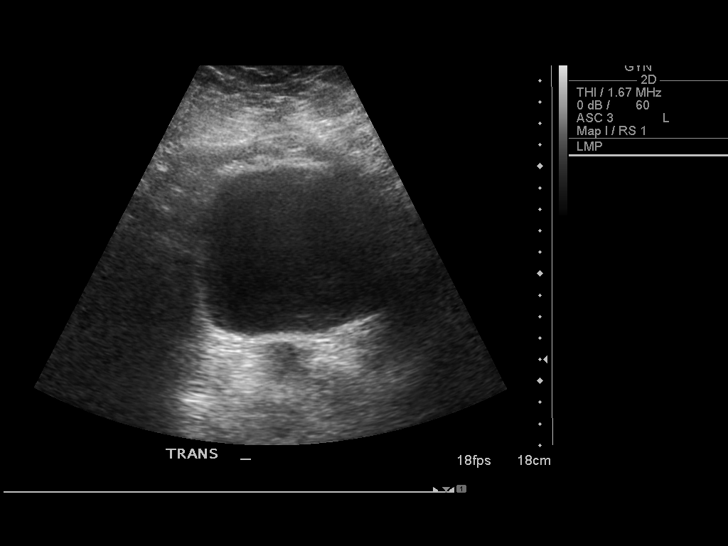
[im 22/58]
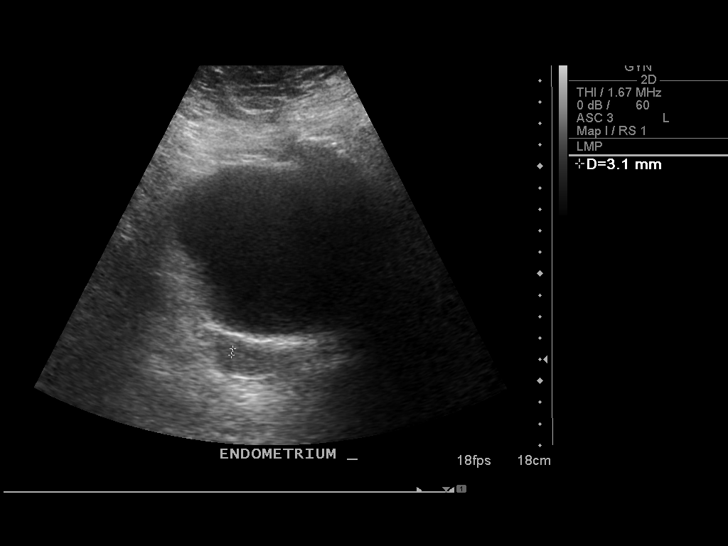
[im 27/58]
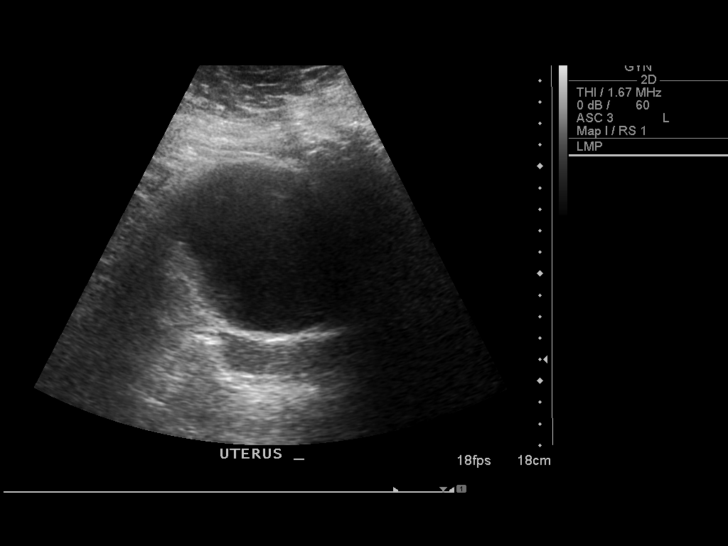
[im 31/58]
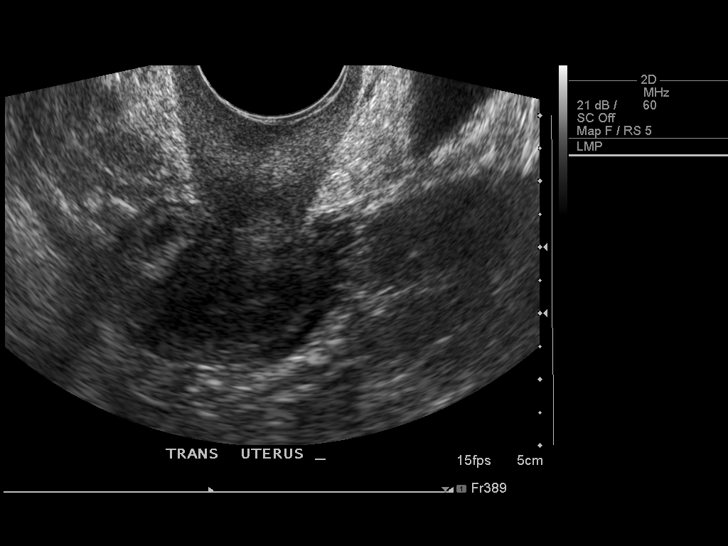
[im 36/58]
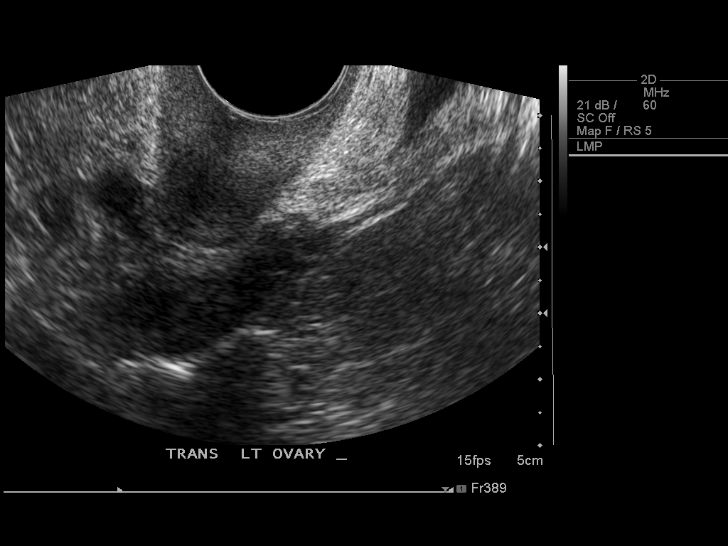
[im 39/58]
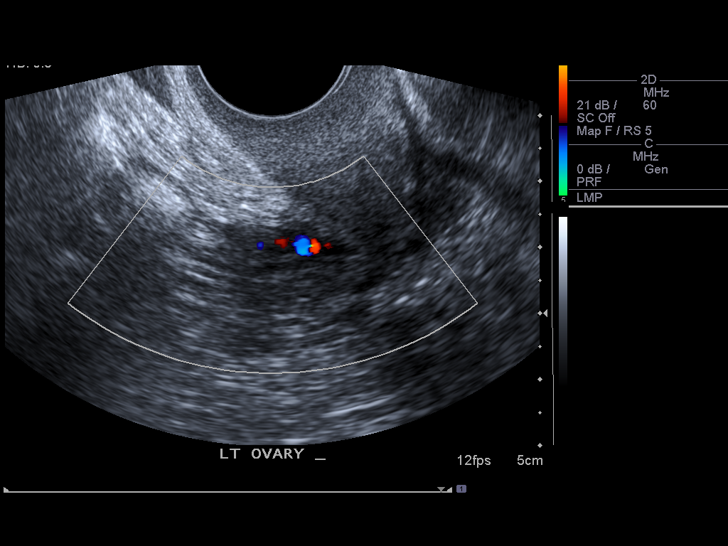
[im 43/58]
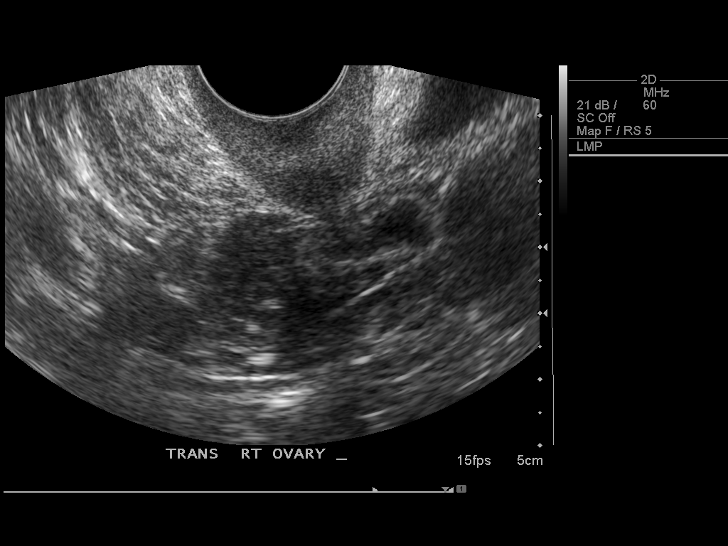
[im 48/58]
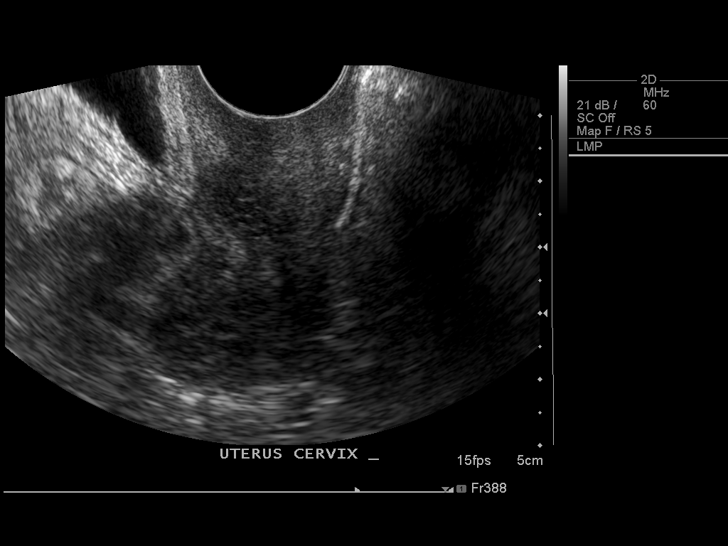
[im 53/58]
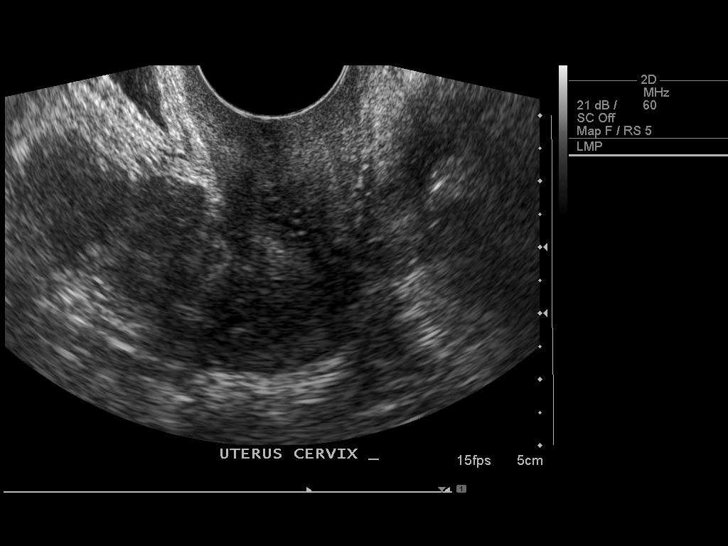
[im 58/58]
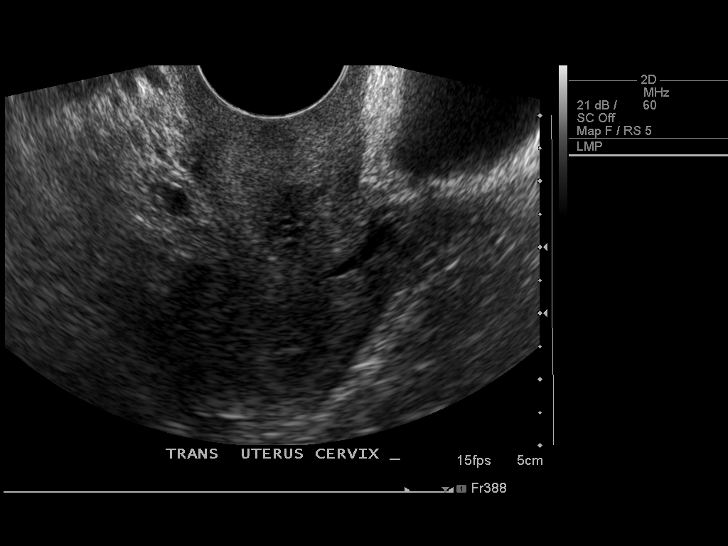

[14 of 25 positions shown; findings below may reference images not displayed]

FINDINGS: Uterus

Measurements: 6.5 x 2.1 x 3.9 cm. No fibroids or other mass
visualized.

Endometrium

Thickness: 4.8 mm.  No focal abnormality visualized.

Right ovary

Measurements: 1.7 x 1.1 x 1.3 cm. Normal appearance/no adnexal mass.

Left ovary

Measurements: 1.5 x 0.9 x 1.1 cm. Normal appearance/no adnexal mass.

Other findings

No abnormal free fluid. Mild thickening of the cervix. This was not
well evaluated as the patient was tender during this portion
examination. Recommend physical examination.
IMPRESSION: Normal ovaries

Mild thickening of the cervix of uncertain significance. Recommend
physical examination of the cervix.

## 2019-09-20 ENCOUNTER — Ambulatory Visit: Payer: BLUE CROSS/BLUE SHIELD | Attending: Internal Medicine

## 2019-09-20 DIAGNOSIS — Z23 Encounter for immunization: Secondary | ICD-10-CM | POA: Insufficient documentation

## 2019-09-20 NOTE — Progress Notes (Signed)
   Covid-19 Vaccination Clinic  Name:  Mattingly Fountaine    MRN: [id number] DOB: [birth year]  09/20/2019  Ms. Zarrella was observed post Covid-19 immunization for 15 minutes without incidence. She was provided with Vaccine Information Sheet and instruction to access the V-Safe system.   Ms. Hantz was instructed to call 911 with any severe reactions post vaccine: Marland Kitchen Difficulty breathing  . Swelling of your face and throat  . A fast heartbeat  . A bad rash all over your body  . Dizziness and weakness    Immunizations Administered    Name Date Dose VIS Date Route   Pfizer COVID-19 Vaccine 09/20/2019  8:43 AM 0.3 mL 08/06/2019 Intramuscular   Manufacturer: [employer], [employer]   Lot: [id number]   NDC: [address]-3

## 2019-10-11 ENCOUNTER — Ambulatory Visit: Payer: BLUE CROSS/BLUE SHIELD | Attending: Internal Medicine

## 2019-10-11 DIAGNOSIS — Z23 Encounter for immunization: Secondary | ICD-10-CM | POA: Insufficient documentation

## 2019-10-11 NOTE — Progress Notes (Signed)
   Covid-19 Vaccination Clinic  Name:  Oluwanifemi Petitti    MRN: [id number] DOB: 1946/01/11  10/11/2019  Ms. Tarquinio was observed post Covid-19 immunization for 15 minutes without incidence. She was provided with Vaccine Information Sheet and instruction to access the V-Safe system.   Ms. Restivo was instructed to call 911 with any severe reactions post vaccine: Marland Kitchen Difficulty breathing  . Swelling of your face and throat  . A fast heartbeat  . A bad rash all over your body  . Dizziness and weakness    Immunizations Administered    Name Date Dose VIS Date Route   Pfizer COVID-19 Vaccine 10/11/2019  9:10 AM 0.3 mL 08/06/2019 Intramuscular   Manufacturer: [employer], [employer]   Lot: [id number]   NDC: [address]-1

## 2020-05-23 ENCOUNTER — Encounter: Admit: 2020-05-23 | Payer: PRIVATE HEALTH INSURANCE | Attending: Cardiovascular Disease | Primary: Internal Medicine

## 2020-05-23 MED ORDER — FOSINOPRIL 40 MG TABLET
40 mg | ORAL_TABLET | 4 refills | Status: AC
Start: 2020-05-23 — End: 2020-06-12

## 2020-06-08 ENCOUNTER — Encounter: Admit: 2020-06-08 | Payer: PRIVATE HEALTH INSURANCE | Attending: Cardiovascular Disease | Primary: Internal Medicine

## 2020-06-12 ENCOUNTER — Encounter: Admit: 2020-06-12 | Payer: PRIVATE HEALTH INSURANCE | Attending: Cardiovascular Disease | Primary: Internal Medicine

## 2020-06-12 ENCOUNTER — Ambulatory Visit: Admit: 2020-06-12 | Payer: BLUE CROSS/BLUE SHIELD | Attending: Cardiovascular Disease | Primary: Internal Medicine

## 2020-06-12 DIAGNOSIS — I10 Essential (primary) hypertension: Secondary | ICD-10-CM

## 2020-06-12 MED ORDER — ROSUVASTATIN 20 MG TABLET
20 mg | ORAL_TABLET | Freq: Every day | ORAL | 4 refills | Status: AC
Start: 2020-06-12 — End: 2021-08-17

## 2020-06-12 MED ORDER — AMLODIPINE 10 MG TABLET
10 mg | ORAL_TABLET | Freq: Every day | ORAL | 4 refills | Status: AC
Start: 2020-06-12 — End: 2021-06-04

## 2020-06-12 MED ORDER — EZETIMIBE 10 MG TABLET
10 mg | ORAL_TABLET | Freq: Every day | ORAL | 4 refills | Status: AC
Start: 2020-06-12 — End: 2021-05-22

## 2020-06-12 MED ORDER — FOSINOPRIL 40 MG TABLET
40 mg | ORAL_TABLET | Freq: Every day | ORAL | 4 refills | Status: AC
Start: 2020-06-12 — End: 2021-10-19

## 2020-06-12 NOTE — Progress Notes
[facility]  [address]  [place], [place] [address]  Phone: [phone]  Fax:  [phone]    Subjective:     Chief Complaint:   [name] comes in today for a follow up evaluation of HTN    History of Present Illness:    74 year old female, former patient of Dr. Clinton Gallant.   I am seeing her in annual f/u having initially evaluated her in 2020.  Generally healthy. Overweight.   Has history of treated HTN, HPL (Monopril, Crestor, Zetia). Cholesterol well controlled per review of labs. LDL 50's-70's.   No history of CAD.  Patient reports history of murmur.  Echo 2018: normal LV size/systolic function. Calcified mitral annulus, otherwise normal.     Today she reports elevated BP. Was at dental office for cleaning. Was canceled as BP too high (>200). Saw Dr. Michel Bickers Amlodipine 5 mg added in June.   Takes BP at home. Still with readings in high 130's - 150's. Exercising more than ever. Water aerobics 3 x per week and PT for hip 5 x per week.   ?  She has had multiple orthopaedic surgeries. Both knees, one hip, both shoulders.   No chest symptoms.   ?  Single. No children.     [place]ives [place] in [place] where she has family and friends.   Originally from [place].   ?  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 History:     Patient's medications, allergies, problem list, past medical, surgical, social and family histories were reviewed and updated as appropriate.    Medications   No outpatient medications have been marked as taking for the 06/12/20 encounter (Appointment) with Sheppard Evens, MD.          Review of Systems:     GEN:  No fever,chills, weight loss  HEENT:  No vision /hearing problems,   Cardiac: as above  Vascular: no claudication  Pulm: no cough , phlegm   GI: no nausea, vomiting, diarrhea, constipation, melena, BRBPR  GU: no hematuria, dysuria  MSK: no myalgias, no joint pains  Heme: no easy bleeding/ cruising Skin: no rash  Neuro: no weakness/numbness/tingling    The remainder of the 12 point review of systems was reviewed with the patient and is negative.     Objective:     Vital Signs:  There were no vitals filed for this visit. (  lbs.)  Wt Readings from Last 3 Encounters:   06/10/19 89.4 kg       Physical Exam:     Gen: NAD, very pleasant   HEENTN: Anicteric,no bruits  Chest: Clear to ausculation  CVS: Regular.   VASC:  Feet warm, no edema, palpable DP/PT  NEURO: Alert and oriented. Grossly non focal  Skin: No rashes      Today's ECG: NSR.     Assessment and Plan:   [name] was seen today for a follow up evaluation.    Her issue currently is  BP which historically was under control.   Currently taking:  Zetia 10  Rosuvastatin20  Fosinopril 40  Amlodipine 5    BP not well controlled despite addition of Amlodipine 5 mg. Will increase to 10 mg.   Have discussed possible side effect of LE edema. She is returning to [facility] where she does not have regular MD.   Should she develop edema then would back down to 5 mg and add  HCTZ 12.5 or 25. Does not come in combination with Fosinopril at 40 but does come in combination at 20 mg dose.   She will call to update when in [place]. and we can make changes need be.     She will get PCP down in [facility]. Have recommended she speak with her brother and sister in law for recommendation.    Tonye Royalty, MD   06/11/2020 6:57 PM

## 2020-06-12 NOTE — Patient Instructions
1. Increase amlodipine to 10 mg. 2. Continue all other meds3. Should you develop edema that is bothersome, then call. 4. Next option would be to decrease amlodipine back to 5 mg and add Hydrochlorothiazide which is another first line agent for BP.

## 2021-05-15 ENCOUNTER — Encounter: Admit: 2021-05-15 | Payer: PRIVATE HEALTH INSURANCE | Attending: Cardiovascular Disease | Primary: Internal Medicine

## 2021-05-22 MED ORDER — EZETIMIBE 10 MG TABLET
10 mg | ORAL_TABLET | 4 refills | Status: AC
Start: 2021-05-22 — End: 2021-10-19

## 2021-05-31 ENCOUNTER — Encounter: Admit: 2021-05-31 | Payer: PRIVATE HEALTH INSURANCE | Attending: Cardiovascular Disease | Primary: Internal Medicine

## 2021-05-31 ENCOUNTER — Ambulatory Visit: Admit: 2021-05-31 | Payer: BLUE CROSS/BLUE SHIELD | Attending: Cardiovascular Disease | Primary: Internal Medicine

## 2021-05-31 DIAGNOSIS — I10 Essential (primary) hypertension: Secondary | ICD-10-CM

## 2021-05-31 NOTE — Progress Notes
[facility], [place] [address]TKZSW: [phone]:  [phone]: Chief Complaint:   [name] comes in today for a follow up evaluation of HTNHistory of Present Illness:  75 year old female, former patient of Dr. Clinton Gallant.?I am seeing her in annual f/u having initially evaluated her in 2020.Generally healthy. Overweight.?Has history of treated HTN, HPL (Monopril, Crestor, Zetia). Cholesterol well controlled per review of labs. LDL 50's-70's. No history of CAD.Patient reports history of murmur.Echo 2018: normal LV size/systolic function. Calcified mitral annulus, otherwise normal.?? Exercising: Water aerobics 3 x per week ?She has had multiple orthopaedic surgeries. Both knees, one hip, both shoulders. No?chest?symptoms. ?Single. No children. Lives [place] in [place] where she has family and friends. Originally from [place].??Retired principal [employer] History: Patient's medications, allergies, problem list, past medical, surgical, social and family histories were reviewed and updated as appropriate.Medications Current Medications Medication Sig ? amLODIPine (NORVASC) 10 mg tablet Take 1 tablet (10 mg total) by mouth daily. ? ezetimibe (ZETIA) 10 mg tablet TAKE 1 TABLET BY MOUTH EVERY DAY ? fosinopriL (MONOPRIL) 40 mg tablet Take 40 mg by mouth daily. ? fosinopriL (MONOPRIL) 40 mg tablet Take 1 tablet (40 mg total) by mouth daily. ? fosinopriL (MONOPRIL) 40 mg tablet Take 1 tablet (40 mg total) by mouth daily. ? rosuvastatin (CRESTOR) 20 mg tablet Take 1 tablet (20 mg total) by mouth daily.  Review of Systems: GEN:  No fever,chills, weight lossHEENT:  No vision /hearing problems, Cardiac: as aboveVascular: no claudicationPulm: no cough , phlegm GI: no nausea, vomiting, diarrhea, constipation, melena, BRBPRGU: no hematuria, dysuriaMSK: no myalgias, no joint painsHeme: no easy bleeding/ cruising Skin: no rashNeuro: no weakness/numbness/tinglingThe remainder of the 12 point review of systems was reviewed with the patient and is negative. Objective: Vital Signs:Vitals:  05/31/21 1000 BP: 134/77 Pulse: 60  (179 lbs.)Wt Readings from Last 3 Encounters: 05/31/21 81.2 kg 06/12/20 82.4 kg 06/10/19 89.4 kg Physical Exam: Gen: NAD, very pleasant HEENTN: Anicteric,no bruitsChest: Clear to ausculationCVS: Regular VASC:	 Feet warm, no edema, palpable DP/PTNEURO: Alert and oriented. Grossly non focalSkin: No rashes  Today's ECG: NSR.  Assessment and Plan: [name] was seen today for a follow up evaluation.Exam and ECG normal. No symptoms. No testing. F/U annually.Diagnoses and all orders for this visit:1. Hypertension, unspecified type- EKG Tonye Royalty, MD 05/31/2021 10:11 AM

## 2021-06-03 ENCOUNTER — Encounter: Admit: 2021-06-03 | Payer: PRIVATE HEALTH INSURANCE | Attending: Cardiovascular Disease | Primary: Internal Medicine

## 2021-06-04 MED ORDER — AMLODIPINE 10 MG TABLET
10 mg | ORAL_TABLET | 4 refills | Status: AC
Start: 2021-06-04 — End: 2021-10-19

## 2021-06-25 ENCOUNTER — Inpatient Hospital Stay: Admit: 2021-06-25 | Discharge: 2021-06-25 | Payer: BLUE CROSS/BLUE SHIELD

## 2021-06-25 ENCOUNTER — Emergency Department: Admit: 2021-06-25 | Payer: BLUE CROSS/BLUE SHIELD | Primary: Internal Medicine

## 2021-06-25 DIAGNOSIS — M25562 Pain in left knee: Secondary | ICD-10-CM

## 2021-06-25 DIAGNOSIS — Z96652 Presence of left artificial knee joint: Secondary | ICD-10-CM

## 2021-06-25 DIAGNOSIS — M7989 Other specified soft tissue disorders: Secondary | ICD-10-CM

## 2021-06-25 MED ORDER — TRAMADOL 50 MG TABLET
50 mg | ORAL_TABLET | Freq: Four times a day (QID) | ORAL | 1 refills | Status: AC | PRN
Start: 2021-06-25 — End: ?

## 2021-06-25 NOTE — Discharge Instructions
Continue taking the Tylenol and/or Ibuprofen during the day for pain.Use the compression wrap and knee immobilizer while ambulating for support.  It can be removed when you are resting and to sleep.Take the Tramadol as needed for moderate to severe pain.  No driving or drinking alcohol while taking this medication.It is important that you follow-up at [facility] this week for re-evaluation.Return to the emergency department should her symptoms acutely change or worsen.

## 2021-06-25 NOTE — ED Provider Notes
? 2014 CD-Notes ?  All Rights ReservedEmergency Department	Chief Complaint:Chief Complaint Patient presents with ? Knee Pain   STATES 3 DAYS AGO DEVELOPED PAIN AND SWELLING IN THE L KNEE.  UNABLE TO RECALL ANY TRAUMA.  PAIN AND SWELLING CONTINUE NOW HAVING Veronica HARD TIME WALKING Veronica Bryant Veronica Bryant, 75 y.o., female, complains of left knee pain.Mechanism of injury felt discomfort when moving from Veronica sitting to standing position.  She felt her knee ?shift? and since this time has had progressively worsening pain and swelling.  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  No recent fall.Pain was first noticed 3 days ago.Severity of pain is moderate.  She has had difficulty sleeping her pain is not well controlled with over-the-counter medications.Associated symptoms include pain with weight-bearing, swelling. Historian: PatientReview of Systems:GEN:		Negative for feverMUSC:		Left knee pain and swelling SKIN:		Negative for rashPast Medical History:No past medical history on file.No past surgical history on file.Medications: Medication List  START taking these medications  traMADoL 50 mg tabletCommonly known as: ULTRAMTake 1 tablet (50 mg total) by mouth every 6 (six) hours as needed for pain for up to 10 days.  ASK your doctor about these medications  amLODIPine 10 mg tabletCommonly known as: NORVASCTAKE 1 TABLET BY MOUTH EVERY DAY ezetimibe 10 mg tabletCommonly known as: ZETIATAKE 1 TABLET BY MOUTH EVERY DAY ezetimibe-simvastatin 10-10 mg per tabletCommonly known as: VYTORIN * fosinopriL 40 mg tabletCommonly known as: MONOPRIL * fosinopriL 40 mg tabletCommonly known as: MONOPRILTake 1 tablet (40 mg total) by mouth daily. * fosinopriL 40 mg tabletCommonly known as: MONOPRILTake 1 tablet (40 mg total) by mouth daily. rosuvastatin 20 mg tabletCommonly known as: CRESTORTake 1 tablet (20 mg total) by mouth daily.   * This list has 3 medication(s) that are the same as other medications prescribed for you. Read the directions carefully, and ask your doctor or other care provider to review them with you.     Where to Get Your Medications  These medications were sent to [id number] - [place], [place] - [address]  [address], [place]  [address]  Phone: [phone] ?	traMADoL 50 mg tablet Allergies as of 06/25/2021 ? (No Known Allergies) Physical Exam:  BP (!) 145/79  - Pulse 76  - Temp 98.7 ?F (37.1 ?C) (Temporal)  - Resp 18  - Wt 80 kg  - SpO2 97%  - BMI 32.26 kg/m? 	General Appear:	Well appearing, no acute distressCardiovascular:	distal pulses intactMusculoskeletal: 	Left knee, swelling involving the anteromedial knee aspect, flexion is limited to 45? secondary to pain, no appreciable joint laxity, no overlying erythema or warmth, calf is supple, no palpable cords in compartments are softSkin:			no rashNeurologic:		alert and oriented, sensory and motor intactMedical Decision Making:Nursing notes were reviewed:  YesI reviewed the following historical information:  Medical recordMDM:  75 year old female presents for evaluation of acute left knee pain that started 3 days ago.  No other associated symptoms including fever chills.Differential:  Left knee sprain, effusion, loosening of hardware, gout, low suspicion but considered for fracture  --- > no fall or significant trauma, DVT -- > swelling to anterior knee, does not involve the calf, no palpable cords, infectious arthrosis ---> no recent illness, no rash or tick bite, no fever chillsPlan:  X-ray imaging, she declined analgesics and will reassessRadiologic Imaging studies: 	Knee: Knee 2V left Final Result 1. Veronica normally positioned three-part knee prosthesis is present. 2. No fracture is demonstrated. 3. Veronica small joint effusion is demonstrated.  Reported and Signed by:  Cheri Guppy, MD  ED Course/Assessment/Plan:X-rays notable for joint effusion and intra-articular loose bodies possibly contributing to her pain.  Prosthesis is in normal position.  She generally takes Tylenol or ibuprofen for her pain.  Requested something stronger to help her sleep this evening, tramadol given.She was placed in Ace wrap, short knee immobilizer and is able to ambulate with the assistance of her cane.  She plans to follow up with [facility] this week for further evaluation to determine the etiology of her acute left knee pain.  She was encouraged to return to the emergency department should her symptoms acutely change or worsen.______________________________________________________________________Condition: 	StableDisposition: 	HomeDiagnosis: 	1. Acute pain of left knee?2014 CD-Notes?  LLC All Rights Reserved; version 2.0; revised April, 2014cdnotes/cdknee Stevie Kern [place], PA10/31/22 1610

## 2021-06-26 NOTE — Telephone Encounter
Call placed no answer message left

## 2021-08-11 ENCOUNTER — Encounter: Admit: 2021-08-11 | Payer: PRIVATE HEALTH INSURANCE | Attending: Cardiovascular Disease | Primary: Internal Medicine

## 2021-08-15 NOTE — Telephone Encounter
LVM for pt to call back to confirm if she is still taking Vytorin?

## 2021-08-17 MED ORDER — ROSUVASTATIN 20 MG TABLET
20 mg | ORAL_TABLET | 4 refills | Status: AC
Start: 2021-08-17 — End: 2021-10-19

## 2021-08-22 ENCOUNTER — Encounter (HOSPITAL_BASED_OUTPATIENT_CLINIC_OR_DEPARTMENT_OTHER): Payer: Self-pay | Admitting: Physical Therapy

## 2021-08-22 ENCOUNTER — Ambulatory Visit (HOSPITAL_BASED_OUTPATIENT_CLINIC_OR_DEPARTMENT_OTHER): Payer: BLUE CROSS/BLUE SHIELD | Attending: Neurology | Admitting: Physical Therapy

## 2021-08-22 ENCOUNTER — Other Ambulatory Visit: Payer: Self-pay

## 2021-08-22 DIAGNOSIS — M25662 Stiffness of left knee, not elsewhere classified: Secondary | ICD-10-CM | POA: Insufficient documentation

## 2021-08-22 DIAGNOSIS — R262 Difficulty in walking, not elsewhere classified: Secondary | ICD-10-CM | POA: Insufficient documentation

## 2021-08-22 DIAGNOSIS — M6281 Muscle weakness (generalized): Secondary | ICD-10-CM | POA: Insufficient documentation

## 2021-08-22 NOTE — Therapy (Signed)
OUTPATIENT PHYSICAL THERAPY LOWER EXTREMITY EVALUATION   Patient Name: Regina Rhodes MRN: [id number] DOB:1945-10-27, 75 y.o., female Today's Date: 08/22/2021   PT End of Session - 08/22/21 0926     Visit Number 1    Number of Visits 17    Date for PT Re-Evaluation 11/20/21    Authorization Type BCBS    PT Start Time 0930    PT Stop Time 1015    PT Time Calculation (min) 45 min    Activity Tolerance Patient tolerated treatment well    Behavior During Therapy WFL for tasks assessed/performed             Past Medical History:  Diagnosis Date   Arthritis    Bronchitis    hasn't had any in over 8 yrs   Fibromyalgia    Heart murmur    'functional heart murmur"   Hyperlipemia    Hypertension    Past Surgical History:  Procedure Laterality Date   APPENDECTOMY     BACK SURGERY     for stenosis   CHOLECYSTECTOMY  1999   JOINT REPLACEMENT Bilateral    knee   LUMBAR FUSION  2004   L4 L5   REPLACEMENT TOTAL KNEE BILATERAL Bilateral 1990, 2004   TOTAL HIP ARTHROPLASTY Left 2007   TOTAL SHOULDER ARTHROPLASTY Left 08/05/2013   Procedure: LEFT TOTAL SHOULDER ARTHROPLASTY;  Surgeon: Senaida Lange, MD;  Location: [facility] OR;  Service: Orthopedics;  Laterality: Left;   TOTAL SHOULDER ARTHROPLASTY Right 08/31/2015   Procedure: RIGHT TOTAL SHOULDER ARTHROPLASTY;  Surgeon: Francena Hanly, MD;  Location: [facility] OR;  Service: Orthopedics;  Laterality: Right;   X-STOP IMPLANTATION  2005   Patient Active Problem List   Diagnosis Date Noted   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 Then one day she just started having systemic infection symptoms. Nothing was shown to be broken in the X-ray. At that time, she started having extreme pain in the L knee, unable to perform standing. This occurred in [place]. She went to the ED in [place] and found an infection. She took out the spacer in the knee (piece that was infected) and flushed out the wound- placed new spacer in. This occurred June 25 2021. The incision is closed at this time. Infection likely caused by dental work that happened a few weeks prior according to MD in [place]. Pt denies systemic symptoms and cancer red flags.   Pt states the limitations are: walking distance, ADL/daily mobility, functional capacity, stairs.    Pt states she was doing the [employer] 3x/week prior to surgery.   PERTINENT HISTORY: Bilat TSA, bilat TKA (20+ years)  PAIN:  Are you having pain? No VAS scale: 0/10; pain has gone away in the last two days Pain location: bacl/deep; laterally Pain orientation: Left  PAIN TYPE: dull Pain description: constant  Aggravating factors: walking, standing, ADL, stairs Relieving factors: resting, extending when seated  PRECAUTIONS: None  WEIGHT BEARING RESTRICTIONS No  FALLS:  Has patient fallen in last 6 months? 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  PLOF: Independent with basic ADLs  PATIENT GOALS :  Improved mobility, get back to [employer] classes   OBJECTIVE:   DIAGNOSTIC FINDINGS: N/A within Epic  PATIENT SURVEYS:  FOTO 47 57 at DC MCII 12pts  COGNITION:  Overall cognitive status: Within functional limits for tasks assessed     SENSATION:  Light touch:  Appears intact   POSTURE:  Kyphotic, flexed knee in standing, genu varus  PALPATION: No TTP along L knee  LE AROM/PROM:  A/PROM Right 08/22/2021 Left 08/22/2021  Knee flexion 100 95  Knee extension -4 -11   (Blank rows = not tested)  LE MMT:  MMT Right 08/22/2021 Left 08/22/2021  Knee flexion 33.5 lbs 26.0 lbs  Knee extension 37.8 lbs 25.5   (Blank rows = not tested)    FUNCTIONAL TESTS:  5 times sit to stand: 12.7s  GAIT: Distance walked: 65ft Assistive device utilized: None Level of assistance: Complete Independence Comments: antalgic, flexted knee, lacks TKE, no distinct heel strike or toe off    TODAY'S TREATMENT:   Exercises Heel toe Rocking - 2 x daily - 7 x weekly - 2 sets - 20 reps Long Sitting Quad Set with Towel Roll Under Heel - 2 x daily - 7 x weekly - 1 sets - 15 reps - 3 hold Seated Knee Flexion AAROM - 2 x daily - 7 x weekly - 1 sets - 15 reps - 5 hold    PATIENT EDUCATION:  Education details: MOI, diagnosis, prognosis, anatomy, exercise progression, DOMS expectations, muscle firing,  envelope of function, HEP, POC  Person educated: Patient Education method: Explanation, Demonstration, Tactile cues, Verbal cues, and Handouts Education comprehension: verbalized understanding, returned demonstration, verbal cues required, and tactile cues required   HOME EXERCISE PROGRAM: Access Code: [email or web address] URL: [email or web address] Date: 08/22/2021 Prepared by: Zebedee Iba  ASSESSMENT:  CLINICAL IMPRESSION: Patient is a 75 y.o. female who was seen today for physical therapy evaluation and treatment for CC of L knee pain. Pt's s/s appear consistent with history of L knee TKA and subsequent infection/revision. Pt is largely ROM and strength limited with now well controlled pain. Objective impairments include Abnormal gait, decreased activity tolerance, decreased balance, decreased coordination, decreased endurance, decreased knowledge of  use of DME, decreased mobility, difficulty walking, decreased ROM, decreased strength, hypomobility, increased edema, increased fascial restrictions, increased muscle spasms, impaired flexibility, improper body mechanics, postural dysfunction, obesity, and pain. These impairments are limiting patient from cleaning, community activity, driving, laundry, yard work, shopping, and exercise . Personal factors including Age, Behavior pattern, Fitness, Time since onset of injury/illness/exacerbation, and 1-2 comorbidities:    are also affecting patient's functional outcome. Patient will benefit from skilled PT to address above impairments and improve overall function.  REHAB POTENTIAL: Good  CLINICAL DECISION MAKING: Stable/uncomplicated  EVALUATION COMPLEXITY: Low   GOALS: SHORT TERM GOALS:  STG Name Target Date Goal status  1 Pt will become independent with HEP in order to demonstrate synthesis of PT education.  10/03/2021 INITIAL  2 Pt will be able to demonstrate reciprocal stair management (ascending and descending) in order to demonstrate functional improvement in LE function for self-care and house hold mobility.   10/03/2021  INITIAL  3 Pt will score at least 12 pt increase on FOTO to demonstrate functional improvement in MCII and pt perceived function.   10/03/2021 INITIAL   LONG TERM GOALS:   LTG Name Target Date Goal status  1 Pt  will become independent with final HEP in order to demonstrate synthesis of PT education.  11/14/2021 INITIAL  2 Pt will decrease 5XSTS by at least 3 seconds in order to demonstrate clinically significant improvement in LE strength  11/14/2021 INITIAL  3 Pt will be able to demonstrate/report ability to walk >15 mins without pain in order to demonstrate functional improvement and tolerance to exercise and community mobility.   11/14/2021 INITIAL  4 Pt will score >/= 57 on FOTO to demonstrate improvement in self perceived knee function.   11/14/2021 INITIAL    PLAN: PT FREQUENCY: 1-2x/week  PT DURATION: 12 weeks (likely D/C by 10)  PLANNED INTERVENTIONS: Therapeutic exercises, Therapeutic activity, Neuro Muscular re-education, Balance training, Gait training, Patient/Family education, Joint mobilization, Stair training, Orthotic/Fit training, DME instructions, Aquatic Therapy, Dry Needling, Electrical stimulation, Spinal mobilization, Cryotherapy, Moist heat, scar mobilization, Taping, Vasopneumatic device, Traction, Ultrasound, Ionotophoresis 4mg /ml Dexamethasone, and Manual therapy  PLAN FOR NEXT SESSION: intro to , set up aquatic HEP for self performance at [employer] PT, DPT 08/22/21 12:47 PM

## 2021-08-24 ENCOUNTER — Ambulatory Visit (HOSPITAL_BASED_OUTPATIENT_CLINIC_OR_DEPARTMENT_OTHER): Payer: BLUE CROSS/BLUE SHIELD | Admitting: Physical Therapy

## 2021-08-24 ENCOUNTER — Other Ambulatory Visit: Payer: Self-pay

## 2021-08-24 ENCOUNTER — Encounter (HOSPITAL_BASED_OUTPATIENT_CLINIC_OR_DEPARTMENT_OTHER): Payer: Self-pay | Admitting: Physical Therapy

## 2021-08-24 DIAGNOSIS — M25662 Stiffness of left knee, not elsewhere classified: Secondary | ICD-10-CM

## 2021-08-24 DIAGNOSIS — R262 Difficulty in walking, not elsewhere classified: Secondary | ICD-10-CM

## 2021-08-24 DIAGNOSIS — M6281 Muscle weakness (generalized): Secondary | ICD-10-CM

## 2021-08-24 NOTE — Therapy (Signed)
OUTPATIENT PHYSICAL THERAPY TREATMENT NOTE   Patient Name: Regina Rhodes MRN: [id number] DOB:11-29-1945, 75 y.o., female Today's Date: 08/24/2021  PCP: Oneita Hurt, No REFERRING PROVIDER: Verlene Mayer., MD   PT End of Session - 08/24/21 [phone]     Visit Number 2    Number of Visits 17    Date for PT Re-Evaluation 11/20/21    Authorization Type BCBS    PT Start Time 0901    PT Stop Time 0946    PT Time Calculation (min) 45 min    Activity Tolerance Patient tolerated treatment well    Behavior During Therapy [facility] for tasks assessed/performed             Past Medical History:  Diagnosis Date   Arthritis    Bronchitis    hasn't had any in over 8 yrs   Fibromyalgia    Heart murmur    'functional heart murmur"   Hyperlipemia    Hypertension    Past Surgical History:  Procedure Laterality Date   APPENDECTOMY     BACK SURGERY     for stenosis   CHOLECYSTECTOMY  1999   JOINT REPLACEMENT Bilateral    knee   LUMBAR FUSION  2004   L4 L5   REPLACEMENT TOTAL KNEE BILATERAL Bilateral 1990, 2004   TOTAL HIP ARTHROPLASTY Left 2007   TOTAL SHOULDER ARTHROPLASTY Left 08/05/2013   Procedure: LEFT TOTAL SHOULDER ARTHROPLASTY;  Surgeon: Senaida Lange, MD;  Location: [facility] OR;  Service: Orthopedics;  Laterality: Left;   TOTAL SHOULDER ARTHROPLASTY Right 08/31/2015   Procedure: RIGHT TOTAL SHOULDER ARTHROPLASTY;  Surgeon: Francena Hanly, MD;  Location: [facility] OR;  Service: Orthopedics;  Laterality: Right;   X-STOP IMPLANTATION  2005   Patient Active Problem List   Diagnosis Date Noted   S/P shoulder replacement 08/05/2013   Preoperative clearance 07/15/2013   Hypertension    Hyperlipemia    Fibromyalgia     REFERRING DIAG: T84.54XA (ICD-10-CM) - 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 No VAS scale: 0/10; pain has gone away in the last two days Pain location: bacl/deep; laterally Pain orientation: Left  PAIN TYPE: dull Pain description: constant  Aggravating factors: walking, standing, ADL, stairs Relieving factors: resting, extending when seated  OBJECTIVE:   TODAY'S TREATMENT: Pt seen for aquatic therapy today.  Treatment took place in water 3.25-4.8 ft in depth at the [facility] pool. Temp of water was 94.  Pt entered/exited the pool via stairs step through pattern independently with bilat rail. Introduction to setting; Pt edu on properties of water and benefits of aquatic therapy  Warm up walking fwd, bwd and side stepping multiple widths.  Added submersion of 1 foam hand buoys for core engagement. -gait training   Seated: stretch gastroc, hamstring and add/abd 3x25 sec R/L  -ITB stretch 3 x 30 R/L  -Flutter kick at hip 3x30 (SLR resisted by water). Cues for increasing speed to increase resistance.  -add/abd 3x20  Standing: -Runner stretch -Step ups fwd, side stepping and bwd x10. VC, demonstration and hha (bwd) for proper execution. - Holding to pool side Add/abd; marching; hip extension, mini squats x10 with cues for  tight QA.   Pt requires buoyancy for support and to offload joints with strengthening exercises. Viscosity of the water is needed for resistance of strengthening; water current perturbations provides challenge to standing balance unsupported, requiring increased core activation.       GOALS: SHORT TERM GOALS:   STG Name Target Date Goal status  1 Pt will become independent with HEP in order to demonstrate synthesis of PT education.   10/03/2021 INITIAL  2 Pt will be able to demonstrate reciprocal stair management (ascending and descending) in order to demonstrate functional  improvement in LE function for self-care and house hold mobility.    10/03/2021 INITIAL  3 Pt will score at least 12 pt increase on FOTO to demonstrate functional improvement in MCII and pt perceived function.    10/03/2021 INITIAL    LONG TERM GOALS:    LTG Name Target Date Goal status  1 Pt  will become independent with final HEP in order to demonstrate synthesis of PT education.   11/14/2021 INITIAL  2 Pt will decrease 5XSTS by at least 3 seconds in order to demonstrate clinically significant improvement in LE strength   11/14/2021 INITIAL  3 Pt will be able to demonstrate/report ability to walk >15 mins without pain in order to demonstrate functional improvement and tolerance to exercise and community mobility.    11/14/2021 INITIAL  4 Pt will score >/= 57 on FOTO to demonstrate improvement in self perceived knee function.     11/14/2021 INITIAL        PATIENT EDUCATION:  Education details: MOI, diagnosis, prognosis, anatomy, exercise progression, DOMS expectations, muscle firing,  envelope of function, HEP, POC   Person educated: Patient Education method: Explanation, Demonstration, Tactile cues, Verbal cues, and Handouts Education comprehension: verbalized understanding, returned demonstration, verbal cues required, and tactile cues required     HOME EXERCISE PROGRAM: Access Code: [email or web address] URL: [email or web address] Date: 08/22/2021 Prepared by: Zebedee Iba Added aquatics 08/24/21   ASSESSMENT:   CLINICAL IMPRESSION: Pt introduced to setting.  She demonstrates confidence and indep in pool.  Pt is directed through stretching and strengthening exercises focused on left knee but incorporating bilat LE as a whole as pt with multiple joint arthritic involvement.  She is hesitant to bend and load lle with submerged stair climbing but reports no pain. With repetition she is able to overcome fear to allow for improved movement and therefore strengthening. This translates into  improved gait pattern as evidenced in warm down. She is a great candidate for aquatic therapy as it will facilitate and hasten progression towards meeting ln land functional goals.  Patient is a 75 y.o. female who was seen today for physical therapy evaluation and treatment for CC of L knee pain. Pt's s/s appear consistent with history of L knee TKA and subsequent infection/revision. Pt is largely ROM and strength limited with now well controlled pain. Objective impairments include Abnormal gait, decreased activity tolerance, decreased balance, decreased coordination, decreased endurance, decreased knowledge of use of DME, decreased mobility, difficulty walking, decreased ROM, decreased strength, hypomobility, increased edema, increased fascial restrictions, increased muscle spasms, impaired flexibility, improper body mechanics, postural dysfunction, obesity, and pain. These impairments are limiting patient from cleaning, community activity, driving, laundry, yard work, shopping, and exercise . Personal factors including Age, Behavior pattern, Fitness, Time since onset of injury/illness/exacerbation, and 1-2 comorbidities:    are also affecting patient's functional outcome. Patient will benefit from skilled PT to address above impairments and improve overall function.   REHAB POTENTIAL:  Good   CLINICAL DECISION MAKING: Stable/uncomplicated   EVALUATION COMPLEXITY: Low  PLAN: PT FREQUENCY: 1-2x/week   PT DURATION: 12 weeks (likely D/C by 10)   PLANNED INTERVENTIONS: Therapeutic exercises, Therapeutic activity, Neuro Muscular re-education, Balance training, Gait training, Patient/Family education, Joint mobilization, Stair training, Orthotic/Fit training, DME instructions, Aquatic Therapy, Dry Needling, Electrical stimulation, Spinal mobilization, Cryotherapy, Moist heat, scar mobilization, Taping, Vasopneumatic device, Traction, Ultrasound, Ionotophoresis 4mg /ml Dexamethasone, and Manual therapy    PLAN FOR NEXT SESSION:  set up/laminate aquatic HEP for self performance at [facility]) Kambrey Hagger MPT 08/24/2021, 4:51 PM

## 2021-08-28 ENCOUNTER — Encounter (HOSPITAL_BASED_OUTPATIENT_CLINIC_OR_DEPARTMENT_OTHER): Payer: Self-pay | Admitting: Physical Therapy

## 2021-08-28 ENCOUNTER — Other Ambulatory Visit: Payer: Self-pay

## 2021-08-28 ENCOUNTER — Ambulatory Visit (HOSPITAL_BASED_OUTPATIENT_CLINIC_OR_DEPARTMENT_OTHER): Payer: BLUE CROSS/BLUE SHIELD | Attending: Neurology | Admitting: Physical Therapy

## 2021-08-28 DIAGNOSIS — M6281 Muscle weakness (generalized): Secondary | ICD-10-CM | POA: Insufficient documentation

## 2021-08-28 DIAGNOSIS — M25662 Stiffness of left knee, not elsewhere classified: Secondary | ICD-10-CM | POA: Insufficient documentation

## 2021-08-28 DIAGNOSIS — R262 Difficulty in walking, not elsewhere classified: Secondary | ICD-10-CM | POA: Insufficient documentation

## 2021-08-28 NOTE — Therapy (Signed)
OUTPATIENT PHYSICAL THERAPY TREATMENT NOTE   Patient Name: Argie Ramminglisabeth Genova MRN: [id number] DOB:1946/08/14, 76 y.o., female Today's Date: 08/28/2021  PCP: Oneita HurtPcp, No REFERRING PROVIDER: Verlene MayerGrose, Andrew W., MD   PT End of Session - 08/28/21 0950     Visit Number 4    Number of Visits 17    Date for PT Re-Evaluation 11/20/21    Authorization Type BCBS    PT Start Time 0950    PT Stop Time 1035    PT Time Calculation (min) 45 min    Activity Tolerance Patient tolerated treatment well    Behavior During Therapy WFL for tasks assessed/performed             Past Medical History:  Diagnosis Date   Arthritis    Bronchitis    hasn't had any in over 8 yrs   Fibromyalgia    Heart murmur    'functional heart murmur"   Hyperlipemia    Hypertension    Past Surgical History:  Procedure Laterality Date   APPENDECTOMY     BACK SURGERY     for stenosis   CHOLECYSTECTOMY  1999   JOINT REPLACEMENT Bilateral    knee   LUMBAR FUSION  2004   L4 L5   REPLACEMENT TOTAL KNEE BILATERAL Bilateral 1990, 2004   TOTAL HIP ARTHROPLASTY Left 2007   TOTAL SHOULDER ARTHROPLASTY Left 08/05/2013   Procedure: LEFT TOTAL SHOULDER ARTHROPLASTY;  Surgeon: Senaida LangeKevin M Supple, MD;  Location: [facility] OR;  Service: Orthopedics;  Laterality: Left;   TOTAL SHOULDER ARTHROPLASTY Right 08/31/2015   Procedure: RIGHT TOTAL SHOULDER ARTHROPLASTY;  Surgeon: Francena HanlyKevin Supple, MD;  Location: [facility] OR;  Service: Orthopedics;  Laterality: Right;   X-STOP IMPLANTATION  2005   Patient Active Problem List   Diagnosis Date Noted   S/P shoulder replacement 08/05/2013   Preoperative clearance 07/15/2013   Hypertension    Hyperlipemia    Fibromyalgia     REFERRING DIAG: T84.54XA (ICD-10-CM) - 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 No VAS scale: 0/10; pain has gone away in the last two days Pain location: bacl/deep; laterally Pain orientation: Left  PAIN TYPE: dull Pain description: constant  Aggravating factors: walking, standing, ADL, stairs Relieving factors: resting, extending when seated  OBJECTIVE:   TODAY'S TREATMENT: Pt seen for aquatic therapy today.  Treatment took place in water 3.25-4.8 ft in depth at the [facility]. Temp of water was 94.  Pt entered/exited the pool via stairs step through pattern independently with bilat rail. Introduction to setting; Pt edu on properties of water and benefits of aquatic therapy  Warm up walking fwd, bwd and side stepping multiple widths.  Added submersion of 1 foam hand buoys for core engagement. -gait training   Seated: stretch gastroc, hamstring and add/abd 3x25 sec R/L  -ITB stretch 3 x 30 R/L  -Flutter kick at hip 3x30 (SLR resisted by water). Cues for increasing speed to increase resistance.  -add/abd 3x20  Standing: -Mini squats bottom water step x10 then 2nd step 6 -STS 3 water step. Cues for weight shifting, immediate standing balance and focus on glut and quad -Step ups fwd, side  stepping and bwd x10. Pt with improved execution.  Less UE support needed, improved weight shifting and balance. -SL squat lle for VMO strengthening x 10 on bottom water step - supported yellow hand buoy Add/abd; marching; hip extension, hip flex x12 with cues for tight QA.  Walking, running , lunging fwd and bwd between most exercises for stretching, recovery from exercise and CV benefit.   Pt requires buoyancy for support and to offload joints with strengthening exercises. Viscosity of the water is needed for resistance of strengthening; water current perturbations provides challenge to standing balance  unsupported, requiring increased core activation.       GOALS: SHORT TERM GOALS:   STG Name Target Date Goal status  1 Pt will become independent with HEP in order to demonstrate synthesis of PT education.   10/03/2021 INITIAL  2 Pt will be able to demonstrate reciprocal stair management (ascending and descending) in order to demonstrate functional improvement in LE function for self-care and house hold mobility.    10/03/2021 INITIAL  3 Pt will score at least 12 pt increase on FOTO to demonstrate functional improvement in MCII and pt perceived function.    10/03/2021 INITIAL    LONG TERM GOALS:    LTG Name Target Date Goal status  1 Pt  will become independent with final HEP in order to demonstrate synthesis of PT education.   11/14/2021 INITIAL  2 Pt will decrease 5XSTS by at least 3 seconds in order to demonstrate clinically significant improvement in LE strength   11/14/2021 INITIAL  3 Pt will be able to demonstrate/report ability to walk >15 mins without pain in order to demonstrate functional improvement and tolerance to exercise and community mobility.    11/14/2021 INITIAL  4 Pt will score >/= 57 on FOTO to demonstrate improvement in self perceived knee function.     11/14/2021 INITIAL        PATIENT EDUCATION:  Education details: MOI, diagnosis, prognosis, anatomy, exercise progression, DOMS expectations, muscle firing,  envelope of function, HEP, POC   Person educated: Patient Education method: Explanation, Demonstration, Tactile cues, Verbal cues, and Handouts Education comprehension: verbalized understanding, returned demonstration, verbal cues required, and tactile cues required     HOME EXERCISE PROGRAM: Access Code: [email or web address] URL: [email or web address] Date: 08/22/2021 Prepared by: Zebedee Iba Added aquatics 08/24/21   ASSESSMENT:   CLINICAL IMPRESSION: Left hip abductor weakness appreciated with standing abd.  Cues for fluid motion, pt substitutes with  some hip flex.  Pt instructed to complete on land in front of sink to improve execution to improve strengthening. Progressed core strengthening and balance using hand buoys for UE support rather than pool wall while completed proximal strengtheing, Pt reports some minor soarness after last treatment which subsided after moving around. Pt encouraged with therapy that she is gaining strength.  Was eager to get working. Does need some cuing for rest periods.      Patient is a 76 y.o. female who was seen today for physical therapy evaluation and treatment for CC of L knee pain. Pt's s/s appear consistent with history of L knee TKA and subsequent infection/revision. Pt is largely ROM and strength limited with now well controlled pain. Objective impairments include Abnormal gait, decreased activity tolerance, decreased balance, decreased coordination, decreased endurance, decreased knowledge of use of DME, decreased mobility, difficulty walking, decreased ROM, decreased strength, hypomobility, increased edema, increased fascial restrictions, increased muscle spasms, impaired flexibility, improper body mechanics, postural dysfunction, obesity, and pain. These impairments are limiting patient  from cleaning, community activity, driving, laundry, yard work, shopping, and exercise . Personal factors including Age, Behavior pattern, Fitness, Time since onset of injury/illness/exacerbation, and 1-2 comorbidities:    are also affecting patient's functional outcome. Patient will benefit from skilled PT to address above impairments and improve overall function.   REHAB POTENTIAL: Good   CLINICAL DECISION MAKING: Stable/uncomplicated   EVALUATION COMPLEXITY: Low  PLAN:   PT FREQUENCY: 1-2x/week   PT DURATION: 12 weeks (likely D/C by 10)   PLANNED INTERVENTIONS: Therapeutic exercises, Therapeutic activity, Neuro Muscular re-education, Balance training, Gait training, Patient/Family education, Joint mobilization,  Stair training, Orthotic/Fit training, DME instructions, Aquatic Therapy, Dry Needling, Electrical stimulation, Spinal mobilization, Cryotherapy, Moist heat, scar mobilization, Taping, Vasopneumatic device, Traction, Ultrasound, Ionotophoresis 4mg /ml Dexamethasone, and Manual therapy   PLAN FOR NEXT SESSION:  set up/laminate aquatic HEP for self performance at ([facility]) Meosha Castanon MPT 08/28/2021, 1:25 PM

## 2021-08-30 ENCOUNTER — Ambulatory Visit (HOSPITAL_BASED_OUTPATIENT_CLINIC_OR_DEPARTMENT_OTHER): Payer: BLUE CROSS/BLUE SHIELD | Admitting: Physical Therapy

## 2021-08-30 ENCOUNTER — Encounter (HOSPITAL_BASED_OUTPATIENT_CLINIC_OR_DEPARTMENT_OTHER): Payer: Self-pay | Admitting: Physical Therapy

## 2021-08-30 ENCOUNTER — Other Ambulatory Visit: Payer: Self-pay

## 2021-08-30 DIAGNOSIS — M25662 Stiffness of left knee, not elsewhere classified: Secondary | ICD-10-CM

## 2021-08-30 DIAGNOSIS — R262 Difficulty in walking, not elsewhere classified: Secondary | ICD-10-CM

## 2021-08-30 DIAGNOSIS — M6281 Muscle weakness (generalized): Secondary | ICD-10-CM

## 2021-08-30 NOTE — Therapy (Signed)
OUTPATIENT PHYSICAL THERAPY TREATMENT NOTE   Patient Name: Regina Rhodes MRN: [id number] DOB:26-Nov-1945, 76 y.o., female Today's Date: 08/30/2021  PCP: Oneita HurtPcp, No REFERRING PROVIDER: Verlene MayerGrose, Andrew W., MD   PT End of Session - 08/30/21 1123     Visit Number 5    Number of Visits 17    Date for PT Re-Evaluation 11/20/21    PT Start Time 1120    PT Stop Time 1203    PT Time Calculation (min) 43 min             Past Medical History:  Diagnosis Date   Arthritis    Bronchitis    hasn't had any in over 8 yrs   Fibromyalgia    Heart murmur    'functional heart murmur"   Hyperlipemia    Hypertension    Past Surgical History:  Procedure Laterality Date   APPENDECTOMY     BACK SURGERY     for stenosis   CHOLECYSTECTOMY  1999   JOINT REPLACEMENT Bilateral    knee   LUMBAR FUSION  2004   L4 L5   REPLACEMENT TOTAL KNEE BILATERAL Bilateral 1990, 2004   TOTAL HIP ARTHROPLASTY Left 2007   TOTAL SHOULDER ARTHROPLASTY Left 08/05/2013   Procedure: LEFT TOTAL SHOULDER ARTHROPLASTY;  Surgeon: Senaida LangeKevin M Supple, MD;  Location: [facility] OR;  Service: Orthopedics;  Laterality: Left;   TOTAL SHOULDER ARTHROPLASTY Right 08/31/2015   Procedure: RIGHT TOTAL SHOULDER ARTHROPLASTY;  Surgeon: Francena HanlyKevin Supple, MD;  Location: [facility] OR;  Service: Orthopedics;  Laterality: Right;   X-STOP IMPLANTATION  2005   Patient Active Problem List   Diagnosis Date Noted   S/P shoulder replacement 08/05/2013   Preoperative clearance 07/15/2013   Hypertension    Hyperlipemia    Fibromyalgia     REFERRING DIAG: T84.54XA (ICD-10-CM) - Infection and inflammatory reaction due to internal left knee prosthesis, initial encounter  THERAPY DIAG:  Stiffness of left knee, not elsewhere classified  Difficulty walking  Muscle weakness (generalized)  PERTINENT HISTORY: Bilat TSA, bilat TKA (20+ years)  PRECAUTIONS: None  SUBJECTIVE: "I'm definitely getting stronger""  PAIN:  Are you having pain? No VAS scale:  0/10; pain has gone away in the last two days Pain location: bacl/deep; laterally Pain orientation: Left  PAIN TYPE: dull Pain description: constant  Aggravating factors: walking, standing, ADL, stairs Relieving factors: resting, extending when seated  OBJECTIVE:   TODAY'S TREATMENT: Pt seen for aquatic therapy today.  Treatment took place in water 3.25-4.8 ft in depth at the [facility] pool. Temp of water was 94.  Pt entered/exited the pool via stairs step through pattern independently with bilat rail. Introduction to setting; Pt edu on properties of water and benefits of aquatic therapy  Warm up walking fwd, bwd and side stepping multiple widths.  Added submersion of 1 foam hand buoys for core engagement.  Seated stretch gastroc, hamstring, IT band and add/abd 3x25 sec R/L Standing: Alternating forward lunge x10 Alternatinf backward lunge x10 TR;HR x20 unsupported for added balance challenge -SL squat lle for VMO strengthening 2x10 on bottom water step. Cues for controlled deceleration as well as full knee ext.  -Step ups bwd x10 pt able to progress to completing without UE support x5/10. Fwd x10 without UE support L/R -skipping 6 widths -Mini squats  Seated -Flutter kick at hip 3x30 (SLR resisted by water). Cues for increasing speed to increase resistance.  -add/abd 3x20 - supported yellow hand buoy hip flex x12 with cues for tight  QA. Moved to 4 ft.  Pt with increased difficulty isolating target muscle, due to lack of core control.  Cues for QA and glute tightness  Pt requires buoyancy for support and to offload joints with strengthening exercises. Viscosity of the water is needed for resistance of strengthening; water current perturbations provides challenge to standing balance unsupported, requiring increased core activation.       GOALS: SHORT TERM GOALS:   STG Name Target Date Goal status  1 Pt will become independent with HEP in order to demonstrate synthesis  of PT education.   10/03/2021 INITIAL  2 Pt will be able to demonstrate reciprocal stair management (ascending and descending) in order to demonstrate functional improvement in LE function for self-care and house hold mobility.    10/03/2021 INITIAL  3 Pt will score at least 12 pt increase on FOTO to demonstrate functional improvement in MCII and pt perceived function.    10/03/2021 INITIAL    LONG TERM GOALS:    LTG Name Target Date Goal status  1 Pt  will become independent with final HEP in order to demonstrate synthesis of PT education.   11/14/2021 INITIAL  2 Pt will decrease 5XSTS by at least 3 seconds in order to demonstrate clinically significant improvement in LE strength   11/14/2021 INITIAL  3 Pt will be able to demonstrate/report ability to walk >15 mins without pain in order to demonstrate functional improvement and tolerance to exercise and community mobility.    11/14/2021 INITIAL  4 Pt will score >/= 57 on FOTO to demonstrate improvement in self perceived knee function.     11/14/2021 INITIAL        PATIENT EDUCATION:  Education details: MOI, diagnosis, prognosis, anatomy, exercise progression, DOMS expectations, muscle firing,  envelope of function, HEP, POC   Person educated: Patient Education method: Explanation, Demonstration, Tactile cues, Verbal cues, and Handouts Education comprehension: verbalized understanding, returned demonstration, verbal cues required, and tactile cues required     HOME EXERCISE PROGRAM: Access Code: [email or web address] URL: [email or web address] Date: 08/22/2021 Prepared by: Zebedee Iba Added aquatics 08/24/21   ASSESSMENT:   CLINICAL IMPRESSION: Improving strength as evidenced by pt reports of getting around house and in and out of car with much less difficulty. Also evidenced by completion of exercises through treatment today with need for ue support. Added skipping which pt completes well, added to aerobic capacity element paired with  seated flutter and add/abd. She reported no discomfort in pool today with activity.      Patient is a 76 y.o. female who was seen today for physical therapy evaluation and treatment for CC of L knee pain. Pt's s/s appear consistent with history of L knee TKA and subsequent infection/revision. Pt is largely ROM and strength limited with now well controlled pain. Objective impairments include Abnormal gait, decreased activity tolerance, decreased balance, decreased coordination, decreased endurance, decreased knowledge of use of DME, decreased mobility, difficulty walking, decreased ROM, decreased strength, hypomobility, increased edema, increased fascial restrictions, increased muscle spasms, impaired flexibility, improper body mechanics, postural dysfunction, obesity, and pain. These impairments are limiting patient from cleaning, community activity, driving, laundry, yard work, shopping, and exercise . Personal factors including Age, Behavior pattern, Fitness, Time since onset of injury/illness/exacerbation, and 1-2 comorbidities:    are also affecting patient's functional outcome. Patient will benefit from skilled PT to address above impairments and improve overall function.   REHAB POTENTIAL: Good   CLINICAL DECISION MAKING: Stable/uncomplicated   EVALUATION COMPLEXITY: Low  PLAN:  PT FREQUENCY: 1-2x/week   PT DURATION: 12 weeks (likely D/C by 10)   PLANNED INTERVENTIONS: Therapeutic exercises, Therapeutic activity, Neuro Muscular re-education, Balance training, Gait training, Patient/Family education, Joint mobilization, Stair training, Orthotic/Fit training, DME instructions, Aquatic Therapy, Dry Needling, Electrical stimulation, Spinal mobilization, Cryotherapy, Moist heat, scar mobilization, Taping, Vasopneumatic device, Traction, Ultrasound, Ionotophoresis 4mg /ml Dexamethasone, and Manual therapy   PLAN FOR NEXT SESSION:  set up/laminate aquatic HEP for self performance at  [facility]) Regina Rhodes MPT 08/30/2021, 12:10 PM

## 2021-08-31 ENCOUNTER — Other Ambulatory Visit: Payer: Self-pay

## 2021-08-31 ENCOUNTER — Ambulatory Visit (INDEPENDENT_AMBULATORY_CARE_PROVIDER_SITE_OTHER): Payer: BLUE CROSS/BLUE SHIELD | Admitting: Infectious Diseases

## 2021-08-31 VITALS — BP 140/79 | HR 66 | Resp 16 | Ht 62.0 in | Wt 165.4 lb

## 2021-08-31 DIAGNOSIS — F1090 Alcohol use, unspecified, uncomplicated: Secondary | ICD-10-CM

## 2021-08-31 DIAGNOSIS — T8450XA Infection and inflammatory reaction due to unspecified internal joint prosthesis, initial encounter: Secondary | ICD-10-CM | POA: Insufficient documentation

## 2021-08-31 DIAGNOSIS — Z789 Other specified health status: Secondary | ICD-10-CM | POA: Diagnosis not present

## 2021-08-31 DIAGNOSIS — Z7185 Encounter for immunization safety counseling: Secondary | ICD-10-CM | POA: Diagnosis not present

## 2021-08-31 DIAGNOSIS — Z5181 Encounter for therapeutic drug level monitoring: Secondary | ICD-10-CM | POA: Diagnosis not present

## 2021-08-31 MED ORDER — AMOXICILLIN 500 MG PO CAPS: 500.0000 mg | ORAL_CAPSULE | Freq: Three times a day (TID) | ORAL | 2 refills | Status: DC

## 2021-08-31 NOTE — Progress Notes (Addendum)
Patient Active Problem List   Diagnosis Date Noted   S/P shoulder replacement 08/05/2013   Preoperative clearance 07/15/2013   Hypertension    Hyperlipemia    Fibromyalgia     Patient's Medications  New Prescriptions   No medications on file  Previous Medications   AMOXICILLIN (AMOXIL) 250 MG CHEWABLE TABLET    Chew by mouth 1 day or 1 dose.   DULOXETINE (CYMBALTA) 60 MG CAPSULE    Take 60 mg by mouth daily.   EZETIMIBE (ZETIA) 10 MG TABLET    Take 10 mg by mouth daily.   FOSINOPRIL (MONOPRIL) 40 MG TABLET    Take 40 mg by mouth daily.   NAPROXEN SODIUM (ALEVE PO)    Take 440 mg by mouth daily as needed (for shoulder pain).    OXYCODONE-ACETAMINOPHEN (PERCOCET) 5-325 MG TABLET    Take 1-2 tablets by mouth every 4 (four) hours as needed.   ROSUVASTATIN (CRESTOR) 20 MG TABLET    Take 20 mg by mouth daily.  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  External medical records reviewed. History from external medical records and from patient.  I do not have Operative note of her left knee surgery as well as cultures reports today( has been requested today)  Patient was admitted in the hospital [facility] [place] in November 2022 for left knee pain and swelling.  Had left knee arthrocentesis with WBC count 18,300 with 93% neutrophils.  X-ray knee with total knee arthroplasty and no acute process.  Ultrasound lower extremity negative for DVT, only popliteal fossa cyst.  Underwent left knee exchange on 11/4 and started on long-term IV antibiotics with ceftriaxone 2 g IV daily for 6 weeks starting from 11/4.   Patient has already completed 6 weeks of IV ceftriaxone on 12/16 and is currently taking amoxicillin 500 mg twice daily. PICC line has been removed.   Accompanied by her niece today. She stays in Nov - April/may of the year in [place] with her family and the rest of the year, she stays in [place] with her friend. She denies any infection or revision surgeries for other prosthetic joints. The pain and swelling in the left knee has significantly improved after her left knee surgery and IV abtx course. She is able to walk without support. She is also working with PT. She had questions about her recent inflammatory markers being elevated.  Denies smoking, drinks 2-3 glasses of wine every week., denies IVDU.   Review of Systems: ROS 10 Point ROS done with pertinent positives and negatives as above   Past Medical History:  Diagnosis Date   Arthritis    Bronchitis    hasn't had any in over 8 yrs   Fibromyalgia    Heart murmur    'functional heart murmur"   Hyperlipemia    Hypertension    Past Surgical History:  Procedure Laterality Date   APPENDECTOMY     BACK SURGERY     for stenosis   CHOLECYSTECTOMY  1999   JOINT REPLACEMENT Bilateral    knee   LUMBAR FUSION  2004   L4 L5   REPLACEMENT TOTAL KNEE BILATERAL Bilateral 1990, 2004   TOTAL HIP ARTHROPLASTY Left 2007   TOTAL SHOULDER ARTHROPLASTY Left 08/05/2013   Procedure: LEFT TOTAL SHOULDER ARTHROPLASTY;  Surgeon: Marin Shutter, MD;  Location: [place];  Service: Orthopedics;  Laterality: Left;   TOTAL SHOULDER ARTHROPLASTY Right 08/31/2015   Procedure: RIGHT TOTAL SHOULDER ARTHROPLASTY;  Surgeon: Justice Britain, MD;  Location: [place];  Service: Orthopedics;  Laterality: Right;   X-STOP IMPLANTATION  2005     Social History   Tobacco Use   Smoking status: Never  Substance Use Topics   Alcohol use: Yes    Alcohol/week: 14.0 standard drinks    Types: 14 Glasses of wine per week   Drug use: No    Family History  Problem  Relation Age of Onset   Heart attack Father 80       first at age 57   Hypertension Father    Hypertension Mother    Hypertension Brother    Hyperlipidemia Brother     No Known Allergies  Health Maintenance  Topic Date Due   Hepatitis C Screening  Never done   TETANUS/TDAP  Never done   Zoster Vaccines- Shingrix (1 of 2) Never done   Pneumonia Vaccine 61+ Years old (1 - PCV) Never done   DEXA SCAN  Never done   COVID-19 Vaccine (3 - Booster for Pfizer series) 12/06/2019   INFLUENZA VACCINE  Never done   HPV VACCINES  Aged Out    Objective: BP 140/79    Pulse 66    Resp 16    Ht _0  (1.575 m)    Wt 165 lb 6.4 oz (75 kg)    SpO2 99%    BMI 30.25 kg/m    Physical Exam Constitutional:      Appearance: Normal appearance.  HENT:     Head: Normocephalic and atraumatic.      Mouth: Mucous membranes are moist.  Eyes:    Conjunctiva/sclera: Conjunctivae normal.     Pupils: Pupils are equal, round, and reactive to light.   Cardiovascular:     Rate and Rhythm: Normal rate and regular rhythm.     Heart sounds: No murmur heard.   Pulmonary:     Effort: Pulmonary effort is normal.     Breath sounds: Normal breath sounds.   Abdominal:     General:     Palpations: Abdomen is soft.   Musculoskeletal:        General: Normal range of motion.   Left knee: Good ROM in the setting of recent surgery      Skin:    General: Skin is warm and dry.     Comments:  Neurological:     General: No focal deficit present.     Mental Status: She is awake, alert and oriented to person, place, and time.   Psychiatric:        Mood and Affect: Mood normal.   Lab Results Lab Results  Component Value Date   WBC 5.8 08/23/2015   HGB 13.9 08/23/2015   HCT 43.0 08/23/2015   MCV 98.4 08/23/2015   PLT 210 08/23/2015    Lab Results  Component Value Date   CREATININE 0.72 08/23/2015   BUN 12 08/23/2015   NA 141 08/23/2015   K 4.3 08/23/2015   CL 105 08/23/2015   CO2 30 08/23/2015     Lab Results  Component Value Date   ALT 21  08/23/2015   AST 24 08/23/2015   ALKPHOS 65 08/23/2015   BILITOT 1.0 08/23/2015    No results found for: CHOL, HDL, LDLCALC, LDLDIRECT, TRIG, CHOLHDL No results found for: LABRPR, RPRTITER No results found for: HIV1RNAQUANT, HIV1RNAVL, CD4TABS  Problem List Items Addressed This Visit       Musculoskeletal and Integument   Prosthetic joint infection ([place]) - Primary     Other   Medication monitoring encounter   Relevant Orders   CBC   Comprehensive metabolic panel   C-reactive protein   Sedimentation rate   Alcohol use   Immunization counseling   Assessment/Plan  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 Has received Flu vaccine Counseled for PCV 23 and Shingrix vaccine  I have personally spent 75 minutes involved in face-to-face and non-face-to-face activities for this patient on the day of the visit. Professional time spent includes the following activities: Preparing to see the patient (review of tests), Obtaining and/or reviewing separately obtained history (admission/discharge record), Performing a medically appropriate examination and/or evaluation , Ordering medications/tests/procedures, referring and communicating with other health care professionals, Documenting clinical information in the EMR, Independently interpreting results (not separately reported), Communicating results to the  patient/family/caregiver, Counseling and educating the patient/family/caregiver and Care coordination (not separately reported).   SabWilber OliphantD [place] [facility] [place] 08/31/2021, 12:51 PM

## 2021-09-03 ENCOUNTER — Encounter: Admit: 2021-09-03 | Payer: PRIVATE HEALTH INSURANCE | Attending: Cardiovascular Disease | Primary: Internal Medicine

## 2021-09-03 ENCOUNTER — Encounter (HOSPITAL_BASED_OUTPATIENT_CLINIC_OR_DEPARTMENT_OTHER): Payer: Self-pay | Admitting: Physical Therapy

## 2021-09-03 ENCOUNTER — Ambulatory Visit (HOSPITAL_BASED_OUTPATIENT_CLINIC_OR_DEPARTMENT_OTHER): Payer: BLUE CROSS/BLUE SHIELD | Admitting: Physical Therapy

## 2021-09-03 ENCOUNTER — Other Ambulatory Visit: Payer: Self-pay

## 2021-09-03 DIAGNOSIS — R262 Difficulty in walking, not elsewhere classified: Secondary | ICD-10-CM

## 2021-09-03 DIAGNOSIS — M6281 Muscle weakness (generalized): Secondary | ICD-10-CM

## 2021-09-03 DIAGNOSIS — M25662 Stiffness of left knee, not elsewhere classified: Secondary | ICD-10-CM

## 2021-09-03 NOTE — Therapy (Signed)
OUTPATIENT PHYSICAL THERAPY TREATMENT NOTE   Patient Name: Regina Rhodes MRN: [id number] DOB:23-Oct-1945, 76 y.o., female Today's Date: 09/03/2021  PCP: Oneita HurtPcp, No REFERRING PROVIDER: Verlene MayerGrose, Andrew W., MD   PT End of Session - 09/03/21 1112     Visit Number 6    Number of Visits 17    Date for PT Re-Evaluation 11/20/21    Authorization Type BCBS    PT Start Time 1105    PT Stop Time 1145    PT Time Calculation (min) 40 min    Activity Tolerance Patient tolerated treatment well    Behavior During Therapy WFL for tasks assessed/performed              Past Medical History:  Diagnosis Date   Arthritis    Bronchitis    hasn't had any in over 8 yrs   Fibromyalgia    Heart murmur    'functional heart murmur"   Hyperlipemia    Hypertension    Past Surgical History:  Procedure Laterality Date   APPENDECTOMY     BACK SURGERY     for stenosis   CHOLECYSTECTOMY  1999   JOINT REPLACEMENT Bilateral    knee   LUMBAR FUSION  2004   L4 L5   REPLACEMENT TOTAL KNEE BILATERAL Bilateral 1990, 2004   TOTAL HIP ARTHROPLASTY Left 2007   TOTAL SHOULDER ARTHROPLASTY Left 08/05/2013   Procedure: LEFT TOTAL SHOULDER ARTHROPLASTY;  Surgeon: Senaida LangeKevin M Supple, MD;  Location[facility];  Service: Orthopedics;  Laterality: Left;   TOTAL SHOULDER ARTHROPLASTY Right 08/31/2015   Procedure: RIGHT TOTAL SHOULDER ARTHROPLASTY;  Surgeon: Francena HanlyKevin Supple, MD;  Location: [facility];  Service: Orthopedics;  Laterality: Right;   X-STOP IMPLANTATION  2005   Patient Active Problem List   Diagnosis Date Noted   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   Medication monitoring encounter 08/31/2021   Alcohol use 08/31/2021   Immunization counseling 08/31/2021   S/P shoulder replacement 08/05/2013   Preoperative clearance 07/15/2013   Hypertension    Hyperlipemia    Fibromyalgia     REFERRING DIAG: T84.54XA (ICD-10-CM) - Infection and inflammatory reaction due to internal left knee prosthesis, initial  encounter  THERAPY DIAG:  Stiffness of left knee, not elsewhere classified  Difficulty walking  Muscle weakness (generalized)  PERTINENT HISTORY: Bilat TSA, bilat TKA (20+ years)  PRECAUTIONS: None  SUBJECTIVE: Pt reports no issues from last session. Knee is a little stiff today but no pain.   PAIN:  Are you having pain? No VAS scale: 0/10; pain has gone away in the last two days Pain location: bacl/deep; laterally Pain orientation: Left  PAIN TYPE: dull Pain description: constant  Aggravating factors: walking, standing, ADL, stairs Relieving factors: resting, extending when seated  OBJECTIVE:   TODAY'S TREATMENT: Pt seen for aquatic therapy today.  Treatment took place in water 3.25-4.8 ft in depth at the [facility]. Temp of water was 94.  Pt entered/exited the pool via stairs step through pattern independently with bilat rail.  Warm up walking fwd, bwd and side stepping multiple widths.  Added submersion of 1 foam hand buoys for core engagement.   Standing stair step flexion stretch 10s 10x  Alternating forward lunge 2x10 each  HR off bottom step 5s 2x10 Fwd and lateral step up at stair 2x10 each STS from bench- L knee back 3x10 Standing blue noodle push downs/stomps focus on glute contraction) 2x10 SLS with blue DB support (1 set with, 1 set without) 30s  2x  Pt requires buoyancy for support and to offload joints with strengthening exercises. Viscosity of the water is needed for resistance of strengthening; water current perturbations provides challenge to standing balance unsupported, requiring increased core activation.       PATIENT EDUCATION:  Education details: MOI, diagnosis, prognosis, anatomy, exercise progression, DOMS expectations, muscle firing Person educated: Patient Education method: Explanation, Demonstration, Tactile cues, Verbal cues Education comprehension: verbalized understanding, returned demonstration, verbal cues required,  and tactile cues required     HOME EXERCISE PROGRAM: Access Code: [email or web address] URL: [email or web address] Date: 08/22/2021 Prepared by: Zebedee Iba Added aquatics 08/24/21         GOALS: SHORT TERM GOALS:   STG Name Target Date Goal status  1 Pt will become independent with HEP in order to demonstrate synthesis of PT education.   10/03/2021 INITIAL  2 Pt will be able to demonstrate reciprocal stair management (ascending and descending) in order to demonstrate functional improvement in LE function for self-care and house hold mobility.    10/03/2021 INITIAL  3 Pt will score at least 12 pt increase on FOTO to demonstrate functional improvement in MCII and pt perceived function.    10/03/2021 INITIAL    LONG TERM GOALS:    LTG Name Target Date Goal status  1 Pt  will become independent with final HEP in order to demonstrate synthesis of PT education.   11/14/2021 INITIAL  2 Pt will decrease 5XSTS by at least 3 seconds in order to demonstrate clinically significant improvement in LE strength   11/14/2021 INITIAL  3 Pt will be able to demonstrate/report ability to walk >15 mins without pain in order to demonstrate functional improvement and tolerance to exercise and community mobility.    11/14/2021 INITIAL  4 Pt will score >/= 57 on FOTO to demonstrate improvement in self perceived knee function.     11/14/2021 INITIAL     ASSESSMENT:   CLINICAL IMPRESSION:  Pt continues to be able to progress L LE strength and ROM at today's session. Pt able to tolerate increased CKC loading of the L knee as well as exercise to improve functional knee ROM. Pt likely able to progress from aquatic environment once she has developed and tested her [facility] pool routine. Plan to continue with quad and HS strength as well as SL balance activity at next session. Pt doing very well with PT in the pool environment. No increase in discomfort or pain, only expected DOMS.   Objective impairments include  Abnormal gait, decreased activity tolerance, decreased balance, decreased coordination, decreased endurance, decreased knowledge of use of DME, decreased mobility, difficulty walking, decreased ROM, decreased strength, hypomobility, increased edema, increased fascial restrictions, increased muscle spasms, impaired flexibility, improper body mechanics, postural dysfunction, obesity, and pain. These impairments are limiting patient from cleaning, community activity, driving, laundry, yard work, shopping, and exercise . Personal factors including Age, Behavior pattern, Fitness, Time since onset of injury/illness/exacerbation, and 1-2 comorbidities:    are also affecting patient's functional outcome. Patient will benefit from skilled PT to address above impairments and improve overall function.   REHAB POTENTIAL: Good   CLINICAL DECISION MAKING: Stable/uncomplicated   EVALUATION COMPLEXITY: Low  PLAN:   PT FREQUENCY: 1-2x/week   PT DURATION: 12 weeks (likely D/C by 10)   PLANNED INTERVENTIONS: Therapeutic exercises, Therapeutic activity, Neuro Muscular re-education, Balance training, Gait training, Patient/Family education, Joint mobilization, Stair training, Orthotic/Fit training, DME instructions, Aquatic Therapy, Dry Needling, Electrical stimulation, Spinal mobilization, Cryotherapy, Moist heat, scar mobilization, Taping, Vasopneumatic  device, Traction, Ultrasound, Ionotophoresis 4mg /ml Dexamethasone, and Manual therapy   PLAN FOR NEXT SESSION:  set up/laminate aquatic HEP for self performance at [facility]; FOTO next session**  [facility] PT, DPT 09/03/21 12:47 PM

## 2021-09-04 MED ORDER — FOSINOPRIL 40 MG TABLET
40 mg | ORAL_TABLET | 2 refills | Status: AC
Start: 2021-09-04 — End: 2021-10-19

## 2021-09-05 ENCOUNTER — Other Ambulatory Visit: Payer: Self-pay

## 2021-09-05 ENCOUNTER — Ambulatory Visit (HOSPITAL_BASED_OUTPATIENT_CLINIC_OR_DEPARTMENT_OTHER): Payer: BLUE CROSS/BLUE SHIELD | Admitting: Physical Therapy

## 2021-09-05 ENCOUNTER — Encounter (HOSPITAL_BASED_OUTPATIENT_CLINIC_OR_DEPARTMENT_OTHER): Payer: Self-pay | Admitting: Physical Therapy

## 2021-09-05 DIAGNOSIS — M6281 Muscle weakness (generalized): Secondary | ICD-10-CM

## 2021-09-05 DIAGNOSIS — M25662 Stiffness of left knee, not elsewhere classified: Secondary | ICD-10-CM

## 2021-09-05 DIAGNOSIS — R262 Difficulty in walking, not elsewhere classified: Secondary | ICD-10-CM

## 2021-09-05 NOTE — Therapy (Signed)
OUTPATIENT PHYSICAL THERAPY TREATMENT NOTE   Patient Name: Christiane Sistare MRN: [id number] DOB:[birth year], 76 y.o., female Today's Date: 09/05/2021  PCP: Oneita Hurt, No REFERRING PROVIDER: Verlene Mayer., MD   PT End of Session - 09/05/21 0700     Visit Number 7    Number of Visits 17    Date for PT Re-Evaluation 11/20/21    Authorization Type BCBS    PT Start Time 0900    PT Stop Time 0945    PT Time Calculation (min) 45 min    Activity Tolerance Patient tolerated treatment well    Behavior During Therapy WFL for tasks assessed/performed              Past Medical History:  Diagnosis Date   Arthritis    Bronchitis    hasn't had any in over 8 yrs   Fibromyalgia    Heart murmur    'functional heart murmur"   Hyperlipemia    Hypertension    Past Surgical History:  Procedure Laterality Date   APPENDECTOMY     BACK SURGERY     for stenosis   CHOLECYSTECTOMY  1999   JOINT REPLACEMENT Bilateral    knee   LUMBAR FUSION  2004   L4 L5   REPLACEMENT TOTAL KNEE BILATERAL Bilateral 1990, 2004   TOTAL HIP ARTHROPLASTY Left 2007   TOTAL SHOULDER ARTHROPLASTY Left 08/05/2013   Procedure: LEFT TOTAL SHOULDER ARTHROPLASTY;  Surgeon: Senaida Lange, MD;  Location: [facility];  Service: Orthopedics;  Laterality: Left;   TOTAL SHOULDER ARTHROPLASTY Right 08/31/2015   Procedure: RIGHT TOTAL SHOULDER ARTHROPLASTY;  Surgeon: Francena Hanly, MD;  Location: [facility];  Service: Orthopedics;  Laterality: Right;   X-STOP IMPLANTATION  2005   Patient Active Problem List   Diagnosis Date Noted   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   Medication monitoring encounter 08/31/2021   Alcohol use 08/31/2021   Immunization counseling 08/31/2021   S/P shoulder replacement 08/05/2013   Preoperative clearance 07/15/2013   Hypertension    Hyperlipemia    Fibromyalgia     REFERRING DIAG: T84.54XA (ICD-10-CM) - Infection and inflammatory reaction due to internal left knee prosthesis, initial  encounter  THERAPY DIAG:  Stiffness of left knee, not elsewhere classified  Difficulty walking  Muscle weakness (generalized)  PERTINENT HISTORY: Bilat TSA, bilat TKA (20+ years)  PRECAUTIONS: None  SUBJECTIVE: Pt reports no issues from last session. Knee is a little stiff today but no pain.   PAIN:  Are you having pain? No VAS scale: 0/10; pain has gone away in the last two days Pain location: bacl/deep; laterally Pain orientation: Left  PAIN TYPE: dull Pain description: constant  Aggravating factors: walking, standing, ADL, stairs Relieving factors: resting, extending when seated  OBJECTIVE:   TODAY'S TREATMENT: Pt seen for aquatic therapy today.  Treatment took place in water 3.25-4.8 ft in depth at the [facility] pool. Temp of water was 94.  Pt entered/exited the pool via stairs step through pattern independently with bilat rail.  Warm up walking fwd, bwd and side stepping multiple widths.  Added submersion of 1 foam hand buoys for core engagement.   Standing stair step flexion stretch 10s 10x  -gastroc stretch bottom step  -TR bottom step x15; then SLS x 10 -SL squat with cues for vmo engagement on bottom step then completed x10 on 2nd step increased load. -bwd step ups leading with R then Left.  Cues for control eccentrically -Proximal strengthening supported by yellow hand  buoys hip flex; xt,add/abd 2x10.  2nd set of hip flex quick with slight hop. -alternating knee flex 2x15 -STS from 3rd water step x 10 VC for execution with added exaggerated squat when rising then x10 from 4 step increasing load  Balance challenges: small bos, tandem R/L and SLS R/L with vision then eliminated.  Pt able to hold lall positions > 12 seconds.  Progressed to standing on noodle. (Gaining static balance with heels and then toes on ground x 30 sec (requiring many trails); Then progressed to rotating over noodle gaining balance heel and toe x 5 reps)    Pt requires buoyancy  for support and to offload joints with strengthening exercises. Viscosity of the water is needed for resistance of strengthening; water current perturbations provides challenge to standing balance unsupported, requiring increased core activation.       PATIENT EDUCATION:  Education details: MOI, diagnosis, prognosis, anatomy, exercise progression, DOMS expectations, muscle firing Person educated: Patient Education method: Explanation, Demonstration, Tactile cues, Verbal cues Education comprehension: verbalized understanding, returned demonstration, verbal cues required, and tactile cues required     HOME EXERCISE PROGRAM: Access Code: [email or web address] URL: [email or web address] Date: 08/22/2021 Prepared by: Zebedee Iba Added aquatics 08/24/21         GOALS: SHORT TERM GOALS:   STG Name Target Date Goal status  1 Pt will become independent with HEP in order to demonstrate synthesis of PT education.   10/03/2021 INITIAL  2 Pt will be able to demonstrate reciprocal stair management (ascending and descending) in order to demonstrate functional improvement in LE function for self-care and house hold mobility.    10/03/2021 INITIAL  3 Pt will score at least 12 pt increase on FOTO to demonstrate functional improvement in MCII and pt perceived function.    10/03/2021 INITIAL    LONG TERM GOALS:    LTG Name Target Date Goal status  1 Pt  will become independent with final HEP in order to demonstrate synthesis of PT education.   11/14/2021 INITIAL  2 Pt will decrease 5XSTS by at least 3 seconds in order to demonstrate clinically significant improvement in LE strength   11/14/2021 INITIAL  3 Pt will be able to demonstrate/report ability to walk >15 mins without pain in order to demonstrate functional improvement and tolerance to exercise and community mobility.    11/14/2021 INITIAL  4 Pt will score >/= 57 on FOTO to demonstrate improvement in self perceived knee function.     11/14/2021  INITIAL     ASSESSMENT:   CLINICAL IMPRESSION:  LLE strengthening continues to be progressed.  Good toleration to CKC loading completing SL squats on 1 step higher.  No complaints of discomfort. Progressed balance challenges bilaterally as well as SL. Pt demonstrating good ability. Added noodle standing for higher challenge.    Objective impairments include Abnormal gait, decreased activity tolerance, decreased balance, decreased coordination, decreased endurance, decreased knowledge of use of DME, decreased mobility, difficulty walking, decreased ROM, decreased strength, hypomobility, increased edema, increased fascial restrictions, increased muscle spasms, impaired flexibility, improper body mechanics, postural dysfunction, obesity, and pain. These impairments are limiting patient from cleaning, community activity, driving, laundry, yard work, shopping, and exercise . Personal factors including Age, Behavior pattern, Fitness, Time since onset of injury/illness/exacerbation, and 1-2 comorbidities:    are also affecting patient's functional outcome. Patient will benefit from skilled PT to address above impairments and improve overall function.   REHAB POTENTIAL: Good   CLINICAL DECISION MAKING: Stable/uncomplicated   EVALUATION COMPLEXITY: Low  PLAN:   PT FREQUENCY: 1-2x/week   PT DURATION: 12 weeks (likely D/C by 10)   PLANNED INTERVENTIONS: Therapeutic exercises, Therapeutic activity, Neuro Muscular re-education, Balance training, Gait training, Patient/Family education, Joint mobilization, Stair training, Orthotic/Fit training, DME instructions, Aquatic Therapy, Dry Needling, Electrical stimulation, Spinal mobilization, Cryotherapy, Moist heat, scar mobilization, Taping, Vasopneumatic device, Traction, Ultrasound, Ionotophoresis 4mg /ml Dexamethasone, and Manual therapy   PLAN FOR NEXT SESSION:  set up/laminate [facility] HEP [facility]; FOTO next session**  Corrie DandyMary  Tomma Lightning(Frankie) Shashank Kwasnik MPT 09/05/21 10:45 AM

## 2021-09-10 ENCOUNTER — Ambulatory Visit (HOSPITAL_BASED_OUTPATIENT_CLINIC_OR_DEPARTMENT_OTHER): Payer: BLUE CROSS/BLUE SHIELD | Admitting: Physical Therapy

## 2021-09-10 ENCOUNTER — Encounter (HOSPITAL_BASED_OUTPATIENT_CLINIC_OR_DEPARTMENT_OTHER): Payer: Self-pay | Admitting: Physical Therapy

## 2021-09-10 ENCOUNTER — Other Ambulatory Visit: Payer: Self-pay

## 2021-09-10 DIAGNOSIS — M6281 Muscle weakness (generalized): Secondary | ICD-10-CM

## 2021-09-10 DIAGNOSIS — M25662 Stiffness of left knee, not elsewhere classified: Secondary | ICD-10-CM

## 2021-09-10 DIAGNOSIS — R262 Difficulty in walking, not elsewhere classified: Secondary | ICD-10-CM

## 2021-09-10 NOTE — Therapy (Signed)
OUTPATIENT PHYSICAL THERAPY TREATMENT NOTE   Patient Name: Regina Rhodes MRN: [id number] DOB:1945-10-18, 76 y.o., female Today's Date: 09/10/2021  PCP: Oneita Hurt, No REFERRING PROVIDER: Verlene Mayer., MD   PT End of Session - 09/10/21 1008     Visit Number 8    Number of Visits 17    Date for PT Re-Evaluation 11/20/21    Authorization Type BCBS    PT Start Time 0945    PT Stop Time 1030    PT Time Calculation (min) 45 min    Activity Tolerance Patient tolerated treatment well    Behavior During Therapy WFL for tasks assessed/performed              Past Medical History:  Diagnosis Date   Arthritis    Bronchitis    hasn't had any in over 8 yrs   Fibromyalgia    Heart murmur    'functional heart murmur"   Hyperlipemia    Hypertension    Past Surgical History:  Procedure Laterality Date   APPENDECTOMY     BACK SURGERY     for stenosis   CHOLECYSTECTOMY  1999   JOINT REPLACEMENT Bilateral    knee   LUMBAR FUSION  2004   L4 L5   REPLACEMENT TOTAL KNEE BILATERAL Bilateral 1990, 2004   TOTAL HIP ARTHROPLASTY Left 2007   TOTAL SHOULDER ARTHROPLASTY Left 08/05/2013   Procedure: LEFT TOTAL SHOULDER ARTHROPLASTY;  Surgeon: Senaida Lange, MD;  Location: [facility];  Service: Orthopedics;  Laterality: Left;   TOTAL SHOULDER ARTHROPLASTY Right 08/31/2015   Procedure: RIGHT TOTAL SHOULDER ARTHROPLASTY;  Surgeon: Francena Hanly, MD;  Location: [facility];  Service: Orthopedics;  Laterality: Right;   X-STOP IMPLANTATION  2005   Patient Active Problem List   Diagnosis Date Noted   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   Medication monitoring encounter 08/31/2021   Alcohol use 08/31/2021   Immunization counseling 08/31/2021   S/P shoulder replacement 08/05/2013   Preoperative clearance 07/15/2013   Hypertension    Hyperlipemia    Fibromyalgia     REFERRING DIAG: T84.54XA (ICD-10-CM) - Infection and inflammatory reaction due to internal left knee prosthesis, initial  encounter  THERAPY DIAG:  Stiffness of left knee, not elsewhere classified  Difficulty walking  Muscle weakness (generalized)  PERTINENT HISTORY: Bilat TSA, bilat TKA (20+ years)  PRECAUTIONS: None  SUBJECTIVE: " I sat in the ER with my family member on Sat for 5 hours and my knee is really soar, I hope I haven't done anything to it"  PAIN:  Are you having pain? No VAS scale: 3/10 Pain location: bacl/deep; laterally Pain orientation: Left  PAIN TYPE: dull, tight Pain description: intermittent  Aggravating factors: walking, standing, ADL, stairs Relieving factors: resting, extending when seated  OBJECTIVE:   TODAY'S TREATMENT: Pt seen for aquatic therapy today.  Treatment took place in water 3.25-4.8 ft in depth at the [facility] pool. Temp of water was 94.  Pt entered/exited the pool via stairs step through pattern independently with bilat rail.  Warm up walking fwd, bwd and side stepping multiple widths.  Added submersion of 1 foam hand buoys for core engagement.   Standing stair step flexion stretch 10s 10x  -gastroc stretch bottom step   -Proximal strengthening supported by yellow hand buoys in 4 ft hip flex; ext,add/abd 2x10, alternating knee flex 2x10 -STS from 3rd water step 2 x 10 VC  Balance challenges: small bos, tandem R/L and SLS R/L with vision then  eliminated.  Pt able to hold lall positions > 12 seconds.  Progressed to standing on noodle. (Gaining static balance with heels and then toes on ground x 30 sec (requiring many trails); Then progressed to rotating over noodle gaining balance heel and toe x 5 reps)    Pt requires buoyancy for support and to offload joints with strengthening exercises. Viscosity of the water is needed for resistance of strengthening; water current perturbations provides challenge to standing balance unsupported, requiring increased core activation.       PATIENT EDUCATION:  Education details: MOI, diagnosis, prognosis,  anatomy, exercise progression, DOMS expectations, muscle firing Person educated: Patient Education method: Explanation, Demonstration, Tactile cues, Verbal cues Education comprehension: verbalized understanding, returned demonstration, verbal cues required, and tactile cues required     HOME EXERCISE PROGRAM: Access Code: [email or web address] URL: [email or web address] Date: 08/22/2021 Prepared by: Zebedee IbaAlan Zhou Added aquatics 08/24/21         GOALS: SHORT TERM GOALS:   STG Name Target Date Goal status  1 Pt will become independent with HEP in order to demonstrate synthesis of PT education.   10/03/2021 1/16 INITIAL Achieved  2 Pt will be able to demonstrate reciprocal stair management (ascending and descending) in order to demonstrate functional improvement in LE function for self-care and house hold mobility.    10/03/2021 INITIAL  3 Pt will score at least 12 pt increase on FOTO to demonstrate functional improvement in MCII and pt perceived function.    10/03/2021 1/16 INITIAL Achieved    LONG TERM GOALS:    LTG Name Target Date Goal status  1 Pt  will become independent with final HEP in order to demonstrate synthesis of PT education.   11/14/2021 INITIAL  2 Pt will decrease 5XSTS by at least 3 seconds in order to demonstrate clinically significant improvement in LE strength   11/14/2021 INITIAL  3 Pt will be able to demonstrate/report ability to walk >15 mins without pain in order to demonstrate functional improvement and tolerance to exercise and community mobility.    11/14/2021 INITIAL  4 Pt will score >/= 57 on FOTO to demonstrate improvement in self perceived knee function.     11/14/2021 INITIAL     ASSESSMENT:   CLINICAL IMPRESSION:  Foto completed. Pt progressed from 43 to 57.  Focus of treatment today is ROM, encouraging decreased edema and strengthening. She presents stiff awith increase knee swelling to ankle.  We complete majority of session in 4 ft + for  hydrostatic property benefit.  Upon end of session pt reports knee feeling much looser and more mobile.  She is encouraged to use antiinflammatory as MD has oked, elevate and use ice packs to further decrease edema and discomfort.  Continue with HEP.    Objective impairments include Abnormal gait, decreased activity tolerance, decreased balance, decreased coordination, decreased endurance, decreased knowledge of use of DME, decreased mobility, difficulty walking, decreased ROM, decreased strength, hypomobility, increased edema, increased fascial restrictions, increased muscle spasms, impaired flexibility, improper body mechanics, postural dysfunction, obesity, and pain. These impairments are limiting patient from cleaning, community activity, driving, laundry, yard work, shopping, and exercise . Personal factors including Age, Behavior pattern, Fitness, Time since onset of injury/illness/exacerbation, and 1-2 comorbidities:    are also affecting patient's functional outcome. Patient will benefit from skilled PT to address above impairments and improve overall function.   REHAB POTENTIAL: Good   CLINICAL DECISION MAKING: Stable/uncomplicated   EVALUATION COMPLEXITY: Low  PLAN:   PT FREQUENCY: 1-2x/week   PT  DURATION: 12 weeks (likely D/C by 10)   PLANNED INTERVENTIONS: Therapeutic exercises, Therapeutic activity, Neuro Muscular re-education, Balance training, Gait training, Patient/Family education, Joint mobilization, Stair training, Orthotic/Fit training, DME instructions, Aquatic Therapy, Dry Needling, Electrical stimulation, Spinal mobilization, Cryotherapy, Moist heat, scar mobilization, Taping, Vasopneumatic device, Traction, Ultrasound, Ionotophoresis 4mg /ml Dexamethasone, and Manual therapy   PLAN FOR NEXT SESSION:  set up/laminate aquatic HEP for self performance at [facility]; FOTO next session**  [facility] Corrie Dandy) Filmore Molyneux MPT 09/10/21 2:07 PM

## 2021-09-17 ENCOUNTER — Encounter (HOSPITAL_BASED_OUTPATIENT_CLINIC_OR_DEPARTMENT_OTHER): Payer: Self-pay | Admitting: Physical Therapy

## 2021-09-17 ENCOUNTER — Ambulatory Visit (HOSPITAL_BASED_OUTPATIENT_CLINIC_OR_DEPARTMENT_OTHER): Payer: BLUE CROSS/BLUE SHIELD | Admitting: Physical Therapy

## 2021-09-17 ENCOUNTER — Other Ambulatory Visit: Payer: Self-pay

## 2021-09-17 DIAGNOSIS — M25662 Stiffness of left knee, not elsewhere classified: Secondary | ICD-10-CM | POA: Diagnosis not present

## 2021-09-17 DIAGNOSIS — M6281 Muscle weakness (generalized): Secondary | ICD-10-CM

## 2021-09-17 DIAGNOSIS — R262 Difficulty in walking, not elsewhere classified: Secondary | ICD-10-CM

## 2021-09-17 NOTE — Therapy (Signed)
OUTPATIENT PHYSICAL THERAPY TREATMENT NOTE   Patient Name: Regina Rhodes MRN: [id number] DOB:Mar 09, 1946, 76 y.o., female Today's Date: 09/17/2021  PCP: Oneita Hurt, No REFERRING PROVIDER: Verlene Mayer., MD   PT End of Session - 09/17/21 1202     Visit Number 9    Number of Visits 17    Date for PT Re-Evaluation 11/20/21    Authorization Type BCBS    PT Start Time 1115    PT Stop Time 1200    PT Time Calculation (min) 45 min    Activity Tolerance Patient tolerated treatment well    Behavior During Therapy WFL for tasks assessed/performed               Past Medical History:  Diagnosis Date   Arthritis    Bronchitis    hasn't had any in over 8 yrs   Fibromyalgia    Heart murmur    'functional heart murmur"   Hyperlipemia    Hypertension    Past Surgical History:  Procedure Laterality Date   APPENDECTOMY     BACK SURGERY     for stenosis   CHOLECYSTECTOMY  1999   JOINT REPLACEMENT Bilateral    knee   LUMBAR FUSION  2004   L4 L5   REPLACEMENT TOTAL KNEE BILATERAL Bilateral 1990, 2004   TOTAL HIP ARTHROPLASTY Left 2007   TOTAL SHOULDER ARTHROPLASTY Left 08/05/2013   Procedure: LEFT TOTAL SHOULDER ARTHROPLASTY;  Surgeon: Senaida Lange, MD;  Location: [facility];  Service: Orthopedics;  Laterality: Left;   TOTAL SHOULDER ARTHROPLASTY Right 08/31/2015   Procedure: RIGHT TOTAL SHOULDER ARTHROPLASTY;  Surgeon: Francena Hanly, MD;  Location: [facility];  Service: Orthopedics;  Laterality: Right;   X-STOP IMPLANTATION  2005   Patient Active Problem List   Diagnosis Date Noted   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   Medication monitoring encounter 08/31/2021   Alcohol use 08/31/2021   Immunization counseling 08/31/2021   S/P shoulder replacement 08/05/2013   Preoperative clearance 07/15/2013   Hypertension    Hyperlipemia    Fibromyalgia     REFERRING DIAG: T84.54XA (ICD-10-CM) - 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 No VAS scale: 0/10 Pain location: deep; laterally Pain orientation: Left  PAIN TYPE: dull, tight Pain description: intermittent  Aggravating factors: walking, standing, ADL, stairs Relieving factors: resting, extending when seated  OBJECTIVE:   TODAY'S TREATMENT: Pt seen for aquatic therapy today.  Treatment took place in water 3.25-4.8 ft in depth at the [facility] pool. Temp of water was 94.  Pt entered/exited the pool via stairs step through pattern independently with bilat rail.  Warm up walking fwd, bwd and side stepping multiple widths.  Added submersion of 1 foam hand buoys for core engagement.  Pt completed indep prior to session beginning: Standing stair step flexion stretch 10s 10x  -gastroc stretch bottom step    Plank on bench hip ext 2x10. Cues for positioning and execution Plank using yellow noodle 3 x30 sec.  LB discomfort on 3rd rep.  -Proximal strengthening supported by yellow hand buoys in 4 ft hip flex; ext; add/abd 2x10.  Cues for increased speed for added  resistance and improved core control -Forward Lunge 2x10 -Backward lunge 2x10  Step ups x 2 steps R/L x5 Backward x10 R/L with cues for slowed eccentric movement and knee extension with concentric for improved VMO engagement.   Pt requires buoyancy for support and to offload joints with strengthening exercises. Viscosity of the water is needed for resistance of strengthening; water current perturbations provides challenge to standing balance unsupported, requiring increased core activation.       PATIENT EDUCATION:  Education details: MOI, diagnosis, prognosis,  anatomy, exercise progression, DOMS expectations, muscle firing Person educated: Patient Education method: Explanation, Demonstration, Tactile cues, Verbal cues Education comprehension: verbalized understanding, returned demonstration, verbal cues required, and tactile cues required     HOME EXERCISE PROGRAM: Access Code: [email or web address] URL: [email or web address] Date: 08/22/2021 Prepared by: Daleen Bo Added aquatics 08/24/21  Foto 09/10/21 from 43 to 57.         GOALS: SHORT TERM GOALS:   STG Name Target Date Goal status  1 Pt will become independent with HEP in order to demonstrate synthesis of PT education.   10/03/2021 1/16 INITIAL Achieved  2 Pt will be able to demonstrate reciprocal stair management (ascending and descending) in order to demonstrate functional improvement in LE function for self-care and house hold mobility.    10/03/2021 INITIAL  3 Pt will score at least 12 pt increase on FOTO to demonstrate functional improvement in MCII and pt perceived function.    10/03/2021 1/16 INITIAL Achieved    LONG TERM GOALS:    LTG Name Target Date Goal status  1 Pt  will become independent with final HEP in order to demonstrate synthesis of PT education.   11/14/2021 INITIAL  2 Pt will decrease 5XSTS by at least 3 seconds in order to demonstrate clinically significant improvement in LE strength   11/14/2021 INITIAL  3 Pt will be able to demonstrate/report ability to walk >15 mins without pain in order to demonstrate functional improvement and tolerance to exercise and community mobility.    11/14/2021 INITIAL  4 Pt will score >/= 57 on FOTO to demonstrate improvement in self perceived knee function.     11/14/2021 INITIAL     ASSESSMENT:   CLINICAL IMPRESSION:  Pt reports knee is "functioning quite well now".  Improvement in knee pain and function.  She is needing rest periods after approx 15 mins of walking due to LB discomfort as she reports. Added core  strengthening,  Pilates plank. Tolerates 3 reps prior to fatigue in LB.  Focused on proximal strengthening then eccentric control of knees with step ups for improved VMO control. She tolerates well.  Demonstrates indep with warming up and stretching.  Progressing well.        Objective impairments include Abnormal gait, decreased activity tolerance, decreased balance, decreased coordination, decreased endurance, decreased knowledge of use of DME, decreased mobility, difficulty walking, decreased ROM, decreased strength, hypomobility, increased edema, increased fascial restrictions, increased muscle spasms, impaired flexibility, improper body mechanics, postural dysfunction, obesity, and pain. These impairments are limiting patient from cleaning, community activity, driving, laundry, yard work, shopping, and exercise . Personal factors including Age, Behavior pattern, Fitness, Time since onset of injury/illness/exacerbation, and 1-2 comorbidities:    are also affecting patient's functional outcome. Patient will benefit from skilled PT to address above impairments and improve overall function.   REHAB POTENTIAL: Good   CLINICAL DECISION MAKING: Stable/uncomplicated   EVALUATION COMPLEXITY: Low  PLAN:   PT FREQUENCY: 1-2x/week   PT DURATION: 12 weeks (likely  D/C by 10)   PLANNED INTERVENTIONS: Therapeutic exercises, Therapeutic activity, Neuro Muscular re-education, Balance training, Gait training, Patient/Family education, Joint mobilization, Stair training, Orthotic/Fit training, DME instructions, Aquatic Therapy, Dry Needling, Electrical stimulation, Spinal mobilization, Cryotherapy, Moist heat, scar mobilization, Taping, Vasopneumatic device, Traction, Ultrasound, Ionotophoresis 4mg /ml Dexamethasone, and Manual therapy   PLAN FOR NEXT SESSION:  set up/laminate aquatic HEP for self performance at [facility]) Jolane Bankhead MPT 09/17/21 1:12 PM

## 2021-09-19 ENCOUNTER — Encounter (HOSPITAL_BASED_OUTPATIENT_CLINIC_OR_DEPARTMENT_OTHER): Payer: Self-pay

## 2021-09-19 ENCOUNTER — Ambulatory Visit (HOSPITAL_BASED_OUTPATIENT_CLINIC_OR_DEPARTMENT_OTHER): Payer: BLUE CROSS/BLUE SHIELD | Admitting: Physical Therapy

## 2021-09-21 ENCOUNTER — Other Ambulatory Visit: Payer: Self-pay

## 2021-09-21 ENCOUNTER — Other Ambulatory Visit: Payer: BLUE CROSS/BLUE SHIELD

## 2021-09-21 DIAGNOSIS — Z5181 Encounter for therapeutic drug level monitoring: Secondary | ICD-10-CM

## 2021-09-22 LAB — CBC
HCT: 38.2 % (ref 35.0–45.0)
Hemoglobin: 11.9 g/dL (ref 11.7–15.5)
MCH: 28.3 pg (ref 27.0–33.0)
MCHC: 31.2 g/dL — ABNORMAL LOW (ref 32.0–36.0)
MCV: 91 fL (ref 80.0–100.0)
MPV: 9.7 fL (ref 7.5–12.5)
Platelets: 292 10*3/uL (ref 140–400)
RBC: 4.2 10*6/uL (ref 3.80–5.10)
RDW: 13.8 % (ref 11.0–15.0)
WBC: 4.5 10*3/uL (ref 3.8–10.8)

## 2021-09-22 LAB — COMPREHENSIVE METABOLIC PANEL
AG Ratio: 1.4 (calc) (ref 1.0–2.5)
ALT: 11 U/L (ref 6–29)
AST: 16 U/L (ref 10–35)
Albumin: 4.1 g/dL (ref 3.6–5.1)
Alkaline phosphatase (APISO): 67 U/L (ref 37–153)
BUN: 16 mg/dL (ref 7–25)
CO2: 27 mmol/L (ref 20–32)
Calcium: 9.8 mg/dL (ref 8.6–10.4)
Chloride: 108 mmol/L (ref 98–110)
Creat: 0.84 mg/dL (ref 0.60–1.00)
Globulin: 2.9 g/dL (calc) (ref 1.9–3.7)
Glucose, Bld: 104 mg/dL — ABNORMAL HIGH (ref 65–99)
Potassium: 3.8 mmol/L (ref 3.5–5.3)
Sodium: 142 mmol/L (ref 135–146)
Total Bilirubin: 0.8 mg/dL (ref 0.2–1.2)
Total Protein: 7 g/dL (ref 6.1–8.1)

## 2021-09-22 LAB — SEDIMENTATION RATE: Sed Rate: 41 mm/h — ABNORMAL HIGH (ref 0–30)

## 2021-09-22 LAB — C-REACTIVE PROTEIN: CRP: 3.3 mg/L (ref <8.0)

## 2021-09-24 ENCOUNTER — Other Ambulatory Visit: Payer: Self-pay

## 2021-09-24 ENCOUNTER — Ambulatory Visit (HOSPITAL_BASED_OUTPATIENT_CLINIC_OR_DEPARTMENT_OTHER): Payer: BLUE CROSS/BLUE SHIELD | Admitting: Physical Therapy

## 2021-09-24 ENCOUNTER — Encounter (HOSPITAL_BASED_OUTPATIENT_CLINIC_OR_DEPARTMENT_OTHER): Payer: Self-pay | Admitting: Physical Therapy

## 2021-09-24 DIAGNOSIS — M25662 Stiffness of left knee, not elsewhere classified: Secondary | ICD-10-CM | POA: Diagnosis not present

## 2021-09-24 DIAGNOSIS — R262 Difficulty in walking, not elsewhere classified: Secondary | ICD-10-CM

## 2021-09-24 DIAGNOSIS — M6281 Muscle weakness (generalized): Secondary | ICD-10-CM

## 2021-09-24 NOTE — Therapy (Signed)
OUTPATIENT PHYSICAL THERAPY TREATMENT NOTE Progress Note Reporting Period 08/22/22 to 09/24/21  See note below for Objective Data and Assessment of Progress/Goals.      Patient Name: Regina Rhodes MRN: [id number] DOB:27-Feb-1946, 76 y.o., female Today's Date: 09/24/2021  PCP: Merryl Hacker, No REFERRING PROVIDER: Hilary Hertz., MD   PT End of Session - 09/24/21 1124     Visit Number 10    Number of Visits 17    Date for PT Re-Evaluation 11/20/21    Authorization Type BCBS    PT Start Time 1118    PT Stop Time 1200    PT Time Calculation (min) 42 min    Activity Tolerance Patient tolerated treatment well    Behavior During Therapy WFL for tasks assessed/performed               Past Medical History:  Diagnosis Date   Arthritis    Bronchitis    hasn't had any in over 8 yrs   Fibromyalgia    Heart murmur    'functional heart murmur"   Hyperlipemia    Hypertension    Past Surgical History:  Procedure Laterality Date   APPENDECTOMY     BACK SURGERY     for stenosis   CHOLECYSTECTOMY  1999   JOINT REPLACEMENT Bilateral    knee   LUMBAR FUSION  2004   L4 L5   REPLACEMENT TOTAL KNEE BILATERAL Bilateral 1990, 2004   TOTAL HIP ARTHROPLASTY Left 2007   TOTAL SHOULDER ARTHROPLASTY Left 08/05/2013   Procedure: LEFT TOTAL SHOULDER ARTHROPLASTY;  Surgeon: Marin Shutter, MD;  Location: [place];  Service: Orthopedics;  Laterality: Left;   TOTAL SHOULDER ARTHROPLASTY Right 08/31/2015   Procedure: RIGHT TOTAL SHOULDER ARTHROPLASTY;  Surgeon: Justice Britain, MD;  Location: [place];  Service: Orthopedics;  Laterality: Right;   X-STOP IMPLANTATION  2005   Patient Active Problem List   Diagnosis Date Noted   Prosthetic joint infection ([place]) 08/31/2021   Medication monitoring encounter 08/31/2021   Alcohol use 08/31/2021   Immunization counseling 08/31/2021   S/P shoulder replacement 08/05/2013   Preoperative clearance 07/15/2013   Hypertension    Hyperlipemia     Fibromyalgia     REFERRING DIAG: T84.54XA (ICD-10-CM) - Infection and inflammatory reaction due to internal left knee prosthesis, initial encounter  THERAPY DIAG:  Stiffness of left knee, not elsewhere classified  Difficulty walking  Muscle weakness (generalized)  PERTINENT HISTORY: Bilat TSA, bilat TKA (20+ years)  PRECAUTIONS: None  SUBJECTIVE: "Did some walking over weekend, about 15 mins before I needed to rest "  PAIN:  Are you having pain? No VAS scale: 0/10 Pain location: deep; laterally Pain orientation: Left  PAIN TYPE: dull, tight Pain description: intermittent  Aggravating factors: walking, standing, ADL, stairs Relieving factors: resting, extending when seated  OBJECTIVE:   TODAY'S TREATMENT: Pt seen for aquatic therapy today.  Treatment took place in water 3.25-4.8 ft in depth at the [facility] pool. Temp of water was 94.  Pt entered/exited the pool via stairs step through pattern independently with bilat rail.  Warm up walking fwd, bwd and side stepping multiple widths.     Pt completed indep prior to session beginning: Standing stair step flexion stretch 10s 10x  -gastroc stretch bottom step  -Proximal strengthening supported by yellow hand buoys in 4 ft: hip flex; hip ext; add/abd 2x10.  -Forward Lunge 2x10 -Backward lunge 2x10 -jumping lunges 2x10 -jumping bilaterally with cues for eccentric and concentric control 2x10 -  skipping 3 ft x 6 widths  STS test completed  10.3 secs Foto complete 65 from 57   Pt requires buoyancy for support and to offload joints with strengthening exercises. Viscosity of the water is needed for resistance of strengthening; water current perturbations provides challenge to standing balance unsupported, requiring increased core activation.       PATIENT EDUCATION:  Education details: MOI, diagnosis, prognosis, anatomy, exercise progression, DOMS expectations, muscle firing Person educated: Patient Education  method: Explanation, Demonstration, Tactile cues, Verbal cues Education comprehension: verbalized understanding, returned demonstration, verbal cues required, and tactile cues required     HOME EXERCISE PROGRAM: Access Code: [email or web address] URL: [email or web address] Date: 08/22/2021 Prepared by: Zebedee Iba Added aquatics 08/24/21  Foto 09/10/21 from 43 to 57.         GOALS: SHORT TERM GOALS:   STG Name Target Date Goal status  1 Pt will become independent with HEP in order to demonstrate synthesis of PT education.   10/03/2021 1/16 INITIAL Achieved  2 Pt will be able to demonstrate reciprocal stair management (ascending and descending) in order to demonstrate functional improvement in LE function for self-care and house hold mobility.    10/03/2021 09/24/21 INITIAL Achieved  3 Pt will score at least 12 pt increase on FOTO to demonstrate functional improvement in MCII and pt perceived function.    10/03/2021 1/16 INITIAL Achieved    LONG TERM GOALS:    LTG Name Target Date Goal status  1 Pt  will become independent with final HEP in order to demonstrate synthesis of PT education.   11/14/2021 09/24/21 INITIAL ongoing  2 Pt will decrease 5XSTS by at least 3 seconds in order to demonstrate clinically significant improvement in LE strength   11/14/2021 09/24/21 INITIAL Ongoing  3 Pt will be able to demonstrate/report ability to walk >15 mins without pain in order to demonstrate functional improvement and tolerance to exercise and community mobility.    11/14/2021 09/24/21 INITIAL ongoing  4 Pt will score >/= 57 on FOTO to demonstrate improvement in self perceived knee function.     11/14/2021 09/24/21 INITIAL Ongoing     ASSESSMENT:   CLINICAL IMPRESSION:  Pt reports walking with cane (as instructed) for ~ 15 minutes 3 different days since last visit, stopping due to fatigue not due to pain.  She does state that her hamstring tendons are soar/tight left knee upon  completion. She states she ices and elevate and within an hour it has subsided.  She is not having any other pain (with sleeping or chores/activities). Gait continues to be slightly antalgic. Improved with use of cane. She has made good progress over past few weeks meeting all ST goals and progressing toward LTG. Her Foto today is 65 from 77 two weeks ago. Pt reports using cane and amb ~ 15 minutes prior to needing a recovery period. Very little to no pain reported. She is compliant with HEP.  Will have it laminated for her prior to DC.  She will continue to benefit from physical therapy to further improve strength, mobility and safety, and reach all goals.    Objective impairments include Abnormal gait, decreased activity tolerance, decreased balance, decreased coordination, decreased endurance, decreased knowledge of use of DME, decreased mobility, difficulty walking, decreased ROM, decreased strength, hypomobility, increased edema, increased fascial restrictions, increased muscle spasms, impaired flexibility, improper body mechanics, postural dysfunction, obesity, and pain. These impairments are limiting patient from cleaning, community activity, driving, laundry, yard work, shopping, and exercise . Personal factors  including Age, Behavior pattern, Fitness, Time since onset of injury/illness/exacerbation, and 1-2 comorbidities:    are also affecting patient's functional outcome. Patient will benefit from skilled PT to address above impairments and improve overall function.   REHAB POTENTIAL: Good   CLINICAL DECISION MAKING: Stable/uncomplicated   EVALUATION COMPLEXITY: Low  PLAN:   PT FREQUENCY: 1-2x/week   PT DURATION: 12 weeks (likely D/C by 10)   PLANNED INTERVENTIONS: Therapeutic exercises, Therapeutic activity, Neuro Muscular re-education, Balance training, Gait training, Patient/Family education, Joint mobilization, Stair training, Orthotic/Fit training, DME instructions, Aquatic Therapy,  Dry Needling, Electrical stimulation, Spinal mobilization, Cryotherapy, Moist heat, scar mobilization, Taping, Vasopneumatic device, Traction, Ultrasound, Ionotophoresis 4mg /ml Dexamethasone, and Manual therapy   PLAN FOR NEXT SESSION:  set up/laminate aquatic HEP for self performance at [facility]) Khadija Thier MPT 09/24/21 1:58 PM

## 2021-09-26 ENCOUNTER — Ambulatory Visit (HOSPITAL_BASED_OUTPATIENT_CLINIC_OR_DEPARTMENT_OTHER): Payer: BLUE CROSS/BLUE SHIELD | Attending: Neurology | Admitting: Physical Therapy

## 2021-09-26 ENCOUNTER — Encounter (HOSPITAL_BASED_OUTPATIENT_CLINIC_OR_DEPARTMENT_OTHER): Payer: Self-pay | Admitting: Physical Therapy

## 2021-09-26 ENCOUNTER — Other Ambulatory Visit: Payer: Self-pay

## 2021-09-26 DIAGNOSIS — R262 Difficulty in walking, not elsewhere classified: Secondary | ICD-10-CM | POA: Diagnosis present

## 2021-09-26 DIAGNOSIS — R293 Abnormal posture: Secondary | ICD-10-CM | POA: Insufficient documentation

## 2021-09-26 DIAGNOSIS — M25662 Stiffness of left knee, not elsewhere classified: Secondary | ICD-10-CM | POA: Insufficient documentation

## 2021-09-26 DIAGNOSIS — M6281 Muscle weakness (generalized): Secondary | ICD-10-CM | POA: Diagnosis present

## 2021-09-26 NOTE — Therapy (Signed)
Patient Name: Regina Rhodes MRN: [id number] DOB:22-May-1946, 76 y.o., female Today's Date: 09/26/2021  PCP: Pcp, No REFERRING PROVIDER: No ref. provider found   PT End of Session - 09/26/21 1123     Visit Number 11    Number of Visits 17    Date for PT Re-Evaluation 11/20/21    Authorization Type BCBS    PT Start Time 1116    PT Stop Time 1155    PT Time Calculation (min) 39 min    Activity Tolerance Patient tolerated treatment well    Behavior During Therapy WFL for tasks assessed/performed               Past Medical History:  Diagnosis Date   Arthritis    Bronchitis    hasn't had any in over 8 yrs   Fibromyalgia    Heart murmur    'functional heart murmur"   Hyperlipemia    Hypertension    Past Surgical History:  Procedure Laterality Date   APPENDECTOMY     BACK SURGERY     for stenosis   CHOLECYSTECTOMY  1999   JOINT REPLACEMENT Bilateral    knee   LUMBAR FUSION  2004   L4 L5   REPLACEMENT TOTAL KNEE BILATERAL Bilateral 1990, 2004   TOTAL HIP ARTHROPLASTY Left 2007   TOTAL SHOULDER ARTHROPLASTY Left 08/05/2013   Procedure: LEFT TOTAL SHOULDER ARTHROPLASTY;  Surgeon: Marin Shutter, MD;  Location: [place];  Service: Orthopedics;  Laterality: Left;   TOTAL SHOULDER ARTHROPLASTY Right 08/31/2015   Procedure: RIGHT TOTAL SHOULDER ARTHROPLASTY;  Surgeon: Justice Britain, MD;  Location: [facility];  Service: Orthopedics;  Laterality: Right;   X-STOP IMPLANTATION  2005   Patient Active Problem List   Diagnosis Date Noted   Prosthetic joint infection ([place]) 08/31/2021   Medication monitoring encounter 08/31/2021   Alcohol use 08/31/2021   Immunization counseling 08/31/2021   S/P shoulder replacement 08/05/2013   Preoperative clearance 07/15/2013   Hypertension    Hyperlipemia    Fibromyalgia     REFERRING DIAG: T84.54XA (ICD-10-CM) - Infection and inflammatory reaction due to internal left knee prosthesis, initial encounter  THERAPY DIAG:  Abnormal  posture  Stiffness of left knee, not elsewhere classified  Difficulty walking  Muscle weakness (generalized)  PERTINENT HISTORY: Bilat TSA, bilat TKA (20+ years)  PRECAUTIONS: None  SUBJECTIVE: "No pain just stiffness left knee this mornin "  PAIN:  Are you having pain? No VAS scale: 0/10 Pain location: deep; laterally Pain orientation: Left  PAIN TYPE: dull, tight Pain description: intermittent  Aggravating factors: walking, standing, ADL, stairs Relieving factors: resting, extending when seated  OBJECTIVE:   TODAY'S TREATMENT: Pt seen for aquatic therapy today.  Treatment took place in water 3.25-4.8 ft in depth at the [facility] pool. Temp of water was 94.  Pt entered/exited the pool via stairs step through pattern independently with bilat rail.  Warm up walking fwd, bwd and side stepping multiple widths.     Added 3 lb weights bilaterally Standing stair step flexion stretch 10s 10x  -gastroc stretch bottom step  Seated LAQ 2x15  Satnding -Proximal strengthening supported by yellow hand buoys in 4 ft: hip flex; hip ext; add/abd 2x10.  -jumping bilaterally with cues for eccentric and concentric control 2x10  -skipping 3 ft 2x4 widths -Forward Lunge x10 R/L -Backward lunge x10 R/L   Pt requires buoyancy for support and to offload joints with strengthening exercises. Viscosity of the water is needed for  resistance of strengthening; water current perturbations provides challenge to standing balance unsupported, requiring increased core activation.       PATIENT EDUCATION:  Education details: MOI, diagnosis, prognosis, anatomy, exercise progression, DOMS expectations, muscle firing Person educated: Patient Education method: Explanation, Demonstration, Tactile cues, Verbal cues Education comprehension: verbalized understanding, returned demonstration, verbal cues required, and tactile cues required     HOME EXERCISE PROGRAM: Access Code:  [email or web address] URL: [email or web address] Date: 08/22/2021 Prepared by: Daleen Bo Added aquatics 08/24/21  Foto 09/10/21 from 43 to 57.         GOALS: SHORT TERM GOALS:   STG Name Target Date Goal status  1 Pt will become independent with HEP in order to demonstrate synthesis of PT education.   10/03/2021 1/16 INITIAL Achieved  2 Pt will be able to demonstrate reciprocal stair management (ascending and descending) in order to demonstrate functional improvement in LE function for self-care and house hold mobility.    10/03/2021 09/24/21 INITIAL Achieved  3 Pt will score at least 12 pt increase on FOTO to demonstrate functional improvement in MCII and pt perceived function.    10/03/2021 1/16 INITIAL Achieved    LONG TERM GOALS:    LTG Name Target Date Goal status  1 Pt  will become independent with final HEP in order to demonstrate synthesis of PT education.   11/14/2021 09/24/21 INITIAL ongoing  2 Pt will decrease 5XSTS by at least 3 seconds in order to demonstrate clinically significant improvement in LE strength   11/14/2021 09/24/21 INITIAL Ongoing  3 Pt will be able to demonstrate/report ability to walk >15 mins without pain in order to demonstrate functional improvement and tolerance to exercise and community mobility.    11/14/2021 09/24/21 INITIAL ongoing  4 Pt will score >/= 57 on FOTO to demonstrate improvement in self perceived knee function.     11/14/2021 09/24/21 INITIAL Ongoing     ASSESSMENT:   CLINICAL IMPRESSION:  Pt reporting excellent response from last session.  Added 3 lb weights bilaterally for added challenge/strengthening which she tolerates well.  Will progress strengthening.  Pt without pain.  Stiffness decrease with activity.  All STGs met.  She will continue to benefit from physical therapy to meet all goals and return pt to PLOF.   09-24-21 Progress note Pt reports walking with cane (as instructed) for ~ 15 minutes 3 different days since  last visit, stopping due to fatigue not due to pain.  She does state that her hamstring tendons are soar/tight left knee upon completion. She states she ices and elevate and within an hour it has subsided.  She is not having any other pain (with sleeping or chores/activities). Gait continues to be slightly antalgic. Improved with use of cane. She has made good progress over past few weeks meeting all ST goals and progressing toward LTG. Her Foto today is 65 from 65 two weeks ago. Pt reports using cane and amb ~ 15 minutes prior to needing a recovery period. Very little to no pain reported. She is compliant with HEP.  Will have it laminated for her prior to DC.  She will continue to benefit from physical therapy to further improve strength, mobility and safety, and reach all goals.    Objective impairments include Abnormal gait, decreased activity tolerance, decreased balance, decreased coordination, decreased endurance, decreased knowledge of use of DME, decreased mobility, difficulty walking, decreased ROM, decreased strength, hypomobility, increased edema, increased fascial restrictions, increased muscle spasms, impaired flexibility, improper body mechanics, postural dysfunction, obesity,  and pain. These impairments are limiting patient from cleaning, community activity, driving, laundry, yard work, shopping, and exercise . Personal factors including Age, Behavior pattern, Fitness, Time since onset of injury/illness/exacerbation, and 1-2 comorbidities:    are also affecting patient's functional outcome. Patient will benefit from skilled PT to address above impairments and improve overall function.   REHAB POTENTIAL: Good   CLINICAL DECISION MAKING: Stable/uncomplicated   EVALUATION COMPLEXITY: Low  PLAN:   PT FREQUENCY: 1-2x/week   PT DURATION: 12 weeks (likely D/C by 10)   PLANNED INTERVENTIONS: Therapeutic exercises, Therapeutic activity, Neuro Muscular re-education, Balance training, Gait  training, Patient/Family education, Joint mobilization, Stair training, Orthotic/Fit training, DME instructions, Aquatic Therapy, Dry Needling, Electrical stimulation, Spinal mobilization, Cryotherapy, Moist heat, scar mobilization, Taping, Vasopneumatic device, Traction, Ultrasound, Ionotophoresis 21m/ml Dexamethasone, and Manual therapy   PLAN FOR NEXT SESSION:  set [facility] Micai Apolinar MPT 09/26/21 12:31 PM

## 2021-10-01 ENCOUNTER — Encounter (HOSPITAL_BASED_OUTPATIENT_CLINIC_OR_DEPARTMENT_OTHER): Payer: Self-pay | Admitting: Physical Therapy

## 2021-10-01 ENCOUNTER — Ambulatory Visit (HOSPITAL_BASED_OUTPATIENT_CLINIC_OR_DEPARTMENT_OTHER): Payer: BLUE CROSS/BLUE SHIELD | Admitting: Physical Therapy

## 2021-10-01 ENCOUNTER — Other Ambulatory Visit: Payer: Self-pay

## 2021-10-01 DIAGNOSIS — R293 Abnormal posture: Secondary | ICD-10-CM | POA: Diagnosis not present

## 2021-10-01 DIAGNOSIS — M25662 Stiffness of left knee, not elsewhere classified: Secondary | ICD-10-CM

## 2021-10-01 DIAGNOSIS — M6281 Muscle weakness (generalized): Secondary | ICD-10-CM

## 2021-10-01 DIAGNOSIS — R262 Difficulty in walking, not elsewhere classified: Secondary | ICD-10-CM

## 2021-10-01 NOTE — Therapy (Signed)
Patient Name: Regina Rhodes MRN: [id number] DOB:06/06/1946, 76 y.o., female Today's Date: 10/01/2021  PCP: Oneita Hurt, No REFERRING PROVIDER: Alden Benjamin, MD   PT End of Session - 10/01/21 1125     Visit Number 12    Number of Visits 17    Date for PT Re-Evaluation 11/20/21    Authorization Type BCBS    PT Start Time 1118    PT Stop Time 1200    PT Time Calculation (min) 42 min    Activity Tolerance Patient tolerated treatment well    Behavior During Therapy WFL for tasks assessed/performed               Past Medical History:  Diagnosis Date   Arthritis    Bronchitis    hasn't had any in over 8 yrs   Fibromyalgia    Heart murmur    'functional heart murmur"   Hyperlipemia    Hypertension    Past Surgical History:  Procedure Laterality Date   APPENDECTOMY     BACK SURGERY     for stenosis   CHOLECYSTECTOMY  1999   JOINT REPLACEMENT Bilateral    knee   LUMBAR FUSION  2004   L4 L5   REPLACEMENT TOTAL KNEE BILATERAL Bilateral 1990, 2004   TOTAL HIP ARTHROPLASTY Left 2007   TOTAL SHOULDER ARTHROPLASTY Left 08/05/2013   Procedure: LEFT TOTAL SHOULDER ARTHROPLASTY;  Surgeon: Senaida Lange, MD;  Location: [facility];  Service: Orthopedics;  Laterality: Left;   TOTAL SHOULDER ARTHROPLASTY Right 08/31/2015   Procedure: RIGHT TOTAL SHOULDER ARTHROPLASTY;  Surgeon: Francena Hanly, MD;  Location: [facility];  Service: Orthopedics;  Laterality: Right;   X-STOP IMPLANTATION  2005   Patient Active Problem List   Diagnosis Date Noted   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   Medication monitoring encounter 08/31/2021   Alcohol use 08/31/2021   Immunization counseling 08/31/2021   S/P shoulder replacement 08/05/2013   Preoperative clearance 07/15/2013   Hypertension    Hyperlipemia    Fibromyalgia     REFERRING DIAG: T84.54XA (ICD-10-CM) - Infection and inflammatory reaction due to internal left knee prosthesis, initial encounter  THERAPY DIAG:  Abnormal  posture  Stiffness of left knee, not elsewhere classified  Difficulty walking  Muscle weakness (generalized)  PERTINENT HISTORY: Bilat TSA, bilat TKA (20+ years)  PRECAUTIONS: None  SUBJECTIVE: "Doing well "  PAIN:  Are you having pain? No VAS scale: 1/10 Pain location: deep; laterally Pain orientation: Left  PAIN TYPE: dull, tight Pain description: intermittent  Aggravating factors: walking, standing, ADL, stairs Relieving factors: resting, extending when seated  OBJECTIVE:   TODAY'S TREATMENT: Pt seen for aquatic therapy today.  Treatment took place in water 3.25-4.8 ft in depth at the [facility] pool. Temp of water was 94.  Pt entered/exited the pool via stairs step through pattern independently with bilat rail.  Warm up walking fwd, bwd and side stepping multiple widths.     Seated Stretching Flutter kicking 3x30 rep Add/abd 3x25 reps Strengthening and aerobic capacity benefit  Standing Added 3 lb weights bilaterally  -step ups forward x15; backward x15 leading with L then R ea direction Walking 2 widths after each direction -jumping bilaterally with cues for eccentric and concentric control 2x10  -skipping 3 ft 2x4 widths -Forward Lunge x10 R/L -Backward lunge x10 R/L  Cycling on noodle 5 trials of 20 quick revolutions then 20 slow. For strengthening and aerobic capacity benefit   Pt requires buoyancy for support  and to offload joints with strengthening exercises. Viscosity of the water is needed for resistance of strengthening; water current perturbations provides challenge to standing balance unsupported, requiring increased core activation.       PATIENT EDUCATION:  Education details: MOI, diagnosis, prognosis, anatomy, exercise progression, DOMS expectations, muscle firing Person educated: Patient Education method: Explanation, Demonstration, Tactile cues, Verbal cues Education comprehension: verbalized understanding, returned  demonstration, verbal cues required, and tactile cues required     HOME EXERCISE PROGRAM: Access Code: [email or web address] URL: [email or web address] Date: 08/22/2021 Prepared by: Zebedee Iba Added aquatics 08/24/21  Foto 09/10/21 from 43 to 57.         GOALS: SHORT TERM GOALS:   STG Name Target Date Goal status  1 Pt will become independent with HEP in order to demonstrate synthesis of PT education.   10/03/2021 1/16 INITIAL Achieved  2 Pt will be able to demonstrate reciprocal stair management (ascending and descending) in order to demonstrate functional improvement in LE function for self-care and house hold mobility.    10/03/2021 09/24/21 INITIAL Achieved  3 Pt will score at least 12 pt increase on FOTO to demonstrate functional improvement in MCII and pt perceived function.    10/03/2021 1/16 INITIAL Achieved    LONG TERM GOALS:    LTG Name Target Date Goal status  1 Pt  will become independent with final HEP in order to demonstrate synthesis of PT education.   11/14/2021 09/24/21 INITIAL ongoing  2 Pt will decrease 5XSTS by at least 3 seconds in order to demonstrate clinically significant improvement in LE strength   11/14/2021 09/24/21 INITIAL Ongoing  3 Pt will be able to demonstrate/report ability to walk >15 mins without pain in order to demonstrate functional improvement and tolerance to exercise and community mobility.    11/14/2021 09/24/21 INITIAL ongoing  4 Pt will score >/= 57 on FOTO to demonstrate improvement in self perceived knee function.     11/14/2021 09/24/21 INITIAL Ongoing     ASSESSMENT:   CLINICAL IMPRESSION:  Increased reps of exercises which pt tolerates well for strengthening and aerobic capacity benefit. Encouraged control with concentric and eccentric exercises.  Pt has reached her maximal potential with aquatics.  She is considering membership here at [facility]. She has HEP (laminated and left in front office). Will be seen on land for  re-assessment next visit.    09-24-21 Progress note Pt reports walking with cane (as instructed) for ~ 15 minutes 3 different days since last visit, stopping due to fatigue not due to pain.  She does state that her hamstring tendons are soar/tight left knee upon completion. She states she ices and elevate and within an hour it has subsided.  She is not having any other pain (with sleeping or chores/activities). Gait continues to be slightly antalgic. Improved with use of cane. She has made good progress over past few weeks meeting all ST goals and progressing toward LTG. Her Foto today is 65 from 75 two weeks ago. Pt reports using cane and amb ~ 15 minutes prior to needing a recovery period. Very little to no pain reported. She is compliant with HEP.  Will have it laminated for her prior to DC.  She will continue to benefit from physical therapy to further improve strength, mobility and safety, and reach all goals.    Objective impairments include Abnormal gait, decreased activity tolerance, decreased balance, decreased coordination, decreased endurance, decreased knowledge of use of DME, decreased mobility, difficulty walking, decreased ROM, decreased strength, hypomobility,  increased edema, increased fascial restrictions, increased muscle spasms, impaired flexibility, improper body mechanics, postural dysfunction, obesity, and pain. These impairments are limiting patient from cleaning, community activity, driving, laundry, yard work, shopping, and exercise . Personal factors including Age, Behavior pattern, Fitness, Time since onset of injury/illness/exacerbation, and 1-2 comorbidities:    are also affecting patient's functional outcome. Patient will benefit from skilled PT to address above impairments and improve overall function.   REHAB POTENTIAL: Good   CLINICAL DECISION MAKING: Stable/uncomplicated   EVALUATION COMPLEXITY: Low  PLAN:   PT FREQUENCY: 1-2x/week   PT DURATION: 12 weeks  (likely D/C by 10)   PLANNED INTERVENTIONS: Therapeutic exercises, Therapeutic activity, Neuro Muscular re-education, Balance training, Gait training, Patient/Family education, Joint mobilization, Stair training, Orthotic/Fit training, DME instructions, Aquatic Therapy, Dry Needling, Electrical stimulation, Spinal mobilization, Cryotherapy, Moist heat, scar mobilization, Taping, Vasopneumatic device, Traction, Ultrasound, Ionotophoresis 4mg /ml Dexamethasone, and Manual therapy   PLAN FOR NEXT SESSION:  on land visit.  Did pt pick up laminated HEP from office??  Corrie Dandy) Mccormick [place]on MPT 10/01/21 1:08 PM

## 2021-10-03 ENCOUNTER — Other Ambulatory Visit: Payer: Self-pay

## 2021-10-03 ENCOUNTER — Encounter (HOSPITAL_BASED_OUTPATIENT_CLINIC_OR_DEPARTMENT_OTHER): Payer: Self-pay | Admitting: Physical Therapy

## 2021-10-03 ENCOUNTER — Ambulatory Visit (HOSPITAL_BASED_OUTPATIENT_CLINIC_OR_DEPARTMENT_OTHER): Payer: BLUE CROSS/BLUE SHIELD | Admitting: Physical Therapy

## 2021-10-03 DIAGNOSIS — R293 Abnormal posture: Secondary | ICD-10-CM | POA: Diagnosis not present

## 2021-10-03 DIAGNOSIS — R262 Difficulty in walking, not elsewhere classified: Secondary | ICD-10-CM

## 2021-10-03 DIAGNOSIS — M6281 Muscle weakness (generalized): Secondary | ICD-10-CM

## 2021-10-03 DIAGNOSIS — M25662 Stiffness of left knee, not elsewhere classified: Secondary | ICD-10-CM

## 2021-10-03 NOTE — Therapy (Signed)
PHYSICAL THERAPY PROGRESS NOTE  Progress Note  Reporting Period 07/27/21 to 10/03/21  See note below for Objective Data and Assessment of Progress/Goals.       Patient Name: Regina Rhodes MRN: [id number] DOB:February 17, 1946, 76 y.o., female Today's Date: 10/03/2021  PCP: Oneita Hurt, No REFERRING PROVIDER: Alden Benjamin, MD   PT End of Session - 10/03/21 1025     Visit Number 13    Number of Visits 17    Date for PT Re-Evaluation 11/20/21    Authorization Type BCBS    PT Start Time 1015    PT Stop Time 1055    PT Time Calculation (min) 40 min    Activity Tolerance Patient tolerated treatment well    Behavior During Therapy WFL for tasks assessed/performed                Past Medical History:  Diagnosis Date   Arthritis    Bronchitis    hasn't had any in over 8 yrs   Fibromyalgia    Heart murmur    'functional heart murmur"   Hyperlipemia    Hypertension    Past Surgical History:  Procedure Laterality Date   APPENDECTOMY     BACK SURGERY     for stenosis   CHOLECYSTECTOMY  1999   JOINT REPLACEMENT Bilateral    knee   LUMBAR FUSION  2004   L4 L5   REPLACEMENT TOTAL KNEE BILATERAL Bilateral 1990, 2004   TOTAL HIP ARTHROPLASTY Left 2007   TOTAL SHOULDER ARTHROPLASTY Left 08/05/2013   Procedure: LEFT TOTAL SHOULDER ARTHROPLASTY;  Surgeon: Senaida Lange, MD;  Location: [facility] OR;  Service: Orthopedics;  Laterality: Left;   TOTAL SHOULDER ARTHROPLASTY Right 08/31/2015   Procedure: RIGHT TOTAL SHOULDER ARTHROPLASTY;  Surgeon: Francena Hanly, MD;  Location: [facility] OR;  Service: Orthopedics;  Laterality: Right;   X-STOP IMPLANTATION  2005   Patient Active Problem List   Diagnosis Date Noted   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   Medication monitoring encounter 08/31/2021   Alcohol use 08/31/2021   Immunization counseling 08/31/2021   S/P shoulder replacement 08/05/2013   Preoperative clearance 07/15/2013   Hypertension    Hyperlipemia    Fibromyalgia      REFERRING DIAG: T84.54XA (ICD-10-CM) - Infection and inflammatory reaction due to internal left knee prosthesis, initial encounter  THERAPY DIAG:  Abnormal posture  Stiffness of left knee, not elsewhere classified  Difficulty walking  Muscle weakness (generalized)  PERTINENT HISTORY: Bilat TSA, bilat TKA (20+ years)  PRECAUTIONS: None  SUBJECTIVE: Pt states she has progressed well with aqua therapy but only able to walk about 20 mins at a time. She still has pain in the knee after exercise. She states the back limits her walking more than anything. Stairs have improved significantly since starting. Pt feels like she is 60% better.  PAIN:  Are you having pain? No VAS scale: 0/10 Pain location: deep; laterally Pain orientation: Left  PAIN TYPE: dull, tight Pain description: intermittent  Aggravating factors: walking, standing, ADL, stairs Relieving factors: resting, extending when seated  OBJECTIVE:  PATIENT SURVEYS:  FOTO 47 57 at DC MCII 12pts  Foto 09/10/21 from 43 to 57.  2/8 60 at visit 13pts      LE AROM/PROM:   A/PROM Right 08/22/2021 Left 08/22/2021 L 2/8  Knee flexion 100 95 105  Knee extension -4 -11 -4   (Blank rows = not tested)   LE MMT:   MMT Right 08/22/2021 Left 08/22/2021  R 2/8 L 2/8  Knee flexion 33.5 lbs 26.0 lbs 40.4 lbs 31.9  Knee extension 37.8 lbs 25.5 36.8 lbs 36.7   (Blank rows = not tested)       FUNCTIONAL TESTS:  5 times sit to stand: 12.7s  5XSTS: 10s on 2/8   GAIT: Distance walked: 38ft Assistive device utilized: None Level of assistance: Complete Independence Comments: antalgic, flexted knee, lacks TKE, no distinct heel strike or toe off   TODAY'S TREATMENT:  2/8 Stair step flexion stretch 10s 10x STS with 2" block under L and R 2x10 each TKE blue band 2x10  Review of new land HEP as well as previous aquatic    Previous:  Pt seen for aquatic therapy today.  Treatment took place in water 3.25-4.8 ft in  depth at the [facility] pool. Temp of water was 94.  Pt entered/exited the pool via stairs step through pattern independently with bilat rail.  Warm up walking fwd, bwd and side stepping multiple widths.     Seated Stretching Flutter kicking 3x30 rep Add/abd 3x25 reps Strengthening and aerobic capacity benefit  Standing Added 3 lb weights bilaterally  -step ups forward x15; backward x15 leading with L then R ea direction Walking 2 widths after each direction -jumping bilaterally with cues for eccentric and concentric control 2x10  -skipping 3 ft 2x4 widths -Forward Lunge x10 R/L -Backward lunge x10 R/L  Cycling on noodle 5 trials of 20 quick revolutions then 20 slow. For strengthening and aerobic capacity benefit   Pt requires buoyancy for support and to offload joints with strengthening exercises. Viscosity of the water is needed for resistance of strengthening; water current perturbations provides challenge to standing balance unsupported, requiring increased core activation.       PATIENT EDUCATION:  Education details: exam findings, tapering of land PT, exercise progression, DOMS expectations, muscle firing,  envelope of function, HEP, POC  Person educated: Patient Education method: Explanation, Demonstration, Tactile cues, Verbal cues Education comprehension: verbalized understanding, returned demonstration, verbal cues required, and tactile cues required     HOME EXERCISE PROGRAM: Access Code: [email or web address] URL: [email or web address] Date: 10/03/2021 Prepared by: Zebedee Iba  Exercises Sit to Stand with Arms Crossed - 1 x daily - 7 x weekly - 3 sets - 10 reps Standing Terminal Knee Extension with Resistance - 1 x daily - 7 x weekly - 3 sets - 10 reps - 2 hold Standing Knee Flexion Stretch on Step - 2 x daily - 7 x weekly - 1 sets - 10 reps - 10 hold Forward Walking - 1 x daily - 7 x weekly - 3 sets - 10 reps Standing March at [facility] - 1 x daily  - 7 x weekly - 3 sets - 10 reps Squat - 1 x daily - 7 x weekly - 3 sets - 10 reps Alternating Forward Lunge - 1 x daily - 7 x weekly - 3 sets - 10 reps Alternating Backward Lunge - 1 x daily - 7 x weekly - 3 sets - 10 reps Seated Straddle on Flotation Forward Breast Stroke Arms and Bicycle Legs - 1 x daily - 7 x weekly - 3 sets - 10 reps           GOALS: SHORT TERM GOALS:   STG Name Target Date Goal status  1 Pt will become independent with HEP in order to demonstrate synthesis of PT education.   10/03/2021 1/16 INITIAL Achieved  2 Pt will be able to demonstrate reciprocal  stair management (ascending and descending) in order to demonstrate functional improvement in LE function for self-care and house hold mobility.    10/03/2021 09/24/21 INITIAL Achieved  3 Pt will score at least 12 pt increase on FOTO to demonstrate functional improvement in MCII and pt perceived function.    10/03/2021 1/16 INITIAL Achieved    LONG TERM GOALS:    LTG Name Target Date Goal status  1 Pt  will become independent with final HEP in order to demonstrate synthesis of PT education.   11/14/2021 09/24/21 INITIAL ongoing  2 Pt will decrease 5XSTS by at least 3 seconds in order to demonstrate clinically significant improvement in LE strength   11/14/2021 09/24/21 INITIAL Ongoing  3 Pt will be able to demonstrate/report ability to walk >15 mins without pain in order to demonstrate functional improvement and tolerance to exercise and community mobility.    11/14/2021 09/24/21 INITIAL ongoing  4 Pt will score >/= 57 on FOTO to demonstrate improvement in self perceived knee function.     11/14/2021 09/24/21 INITIAL Ongoing     ASSESSMENT:   CLINICAL IMPRESSION:  Pt has maximized aquatic therapy at this time and will begin transition to land based strength and ROM. Pt demonstrates clinically signficant improve in objective measures as well as FOTO measure- exceeding D/C expectations at this time. Pt with  improved functional mobility. However, she is limited by endurance at this time. Plan to continue with land based strength and intro to gym based strengthening as pt is interested in joining [place] or return to [facility] for fitness classes/exercise. Pt very independent and compliant with HEP. Pt is doing very well and likely able to independently manage exercise progression with tapered visits for guidance.     Objective impairments include Abnormal gait, decreased activity tolerance, decreased balance, decreased coordination, decreased endurance, decreased knowledge of use of DME, decreased mobility, difficulty walking, decreased ROM, decreased strength, hypomobility, increased edema, increased fascial restrictions, increased muscle spasms, impaired flexibility, improper body mechanics, postural dysfunction, obesity, and pain. These impairments are limiting patient from cleaning, community activity, driving, laundry, yard work, shopping, and exercise . Personal factors including Age, Behavior pattern, Fitness, Time since onset of injury/illness/exacerbation, and 1-2 comorbidities:    are also affecting patient's functional outcome. Patient will benefit from skilled PT to address above impairments and improve overall function.   REHAB POTENTIAL: Good   CLINICAL DECISION MAKING: Stable/uncomplicated   EVALUATION COMPLEXITY: Low  PLAN:   PT FREQUENCY: 1-2x/week   PT DURATION: 12 weeks (likely D/C by 10)   PLANNED INTERVENTIONS: Therapeutic exercises, Therapeutic activity, Neuro Muscular re-education, Balance training, Gait training, Patient/Family education, Joint mobilization, Stair training, Orthotic/Fit training, DME instructions, Aquatic Therapy, Dry Needling, Electrical stimulation, Spinal mobilization, Cryotherapy, Moist heat, scar mobilization, Taping, Vasopneumatic device, Traction, Ultrasound, Ionotophoresis 4mg /ml Dexamethasone, and Manual therapy   PLAN FOR NEXT SESSION:  progress land  based exercise, knee extensor strength and L knee ROM hip Abd strength  PT, DPT 10/03/21 11:03 AM

## 2021-10-05 ENCOUNTER — Ambulatory Visit (INDEPENDENT_AMBULATORY_CARE_PROVIDER_SITE_OTHER): Payer: BLUE CROSS/BLUE SHIELD | Admitting: Infectious Diseases

## 2021-10-05 ENCOUNTER — Telehealth: Payer: Self-pay

## 2021-10-05 ENCOUNTER — Other Ambulatory Visit: Payer: Self-pay

## 2021-10-05 ENCOUNTER — Encounter: Payer: Self-pay | Admitting: Infectious Diseases

## 2021-10-05 VITALS — BP 154/79 | HR 64 | Temp 98.0°F | Ht 62.0 in | Wt 164.5 lb

## 2021-10-05 DIAGNOSIS — Z5181 Encounter for therapeutic drug level monitoring: Secondary | ICD-10-CM

## 2021-10-05 DIAGNOSIS — T8450XD Infection and inflammatory reaction due to unspecified internal joint prosthesis, subsequent encounter: Secondary | ICD-10-CM | POA: Diagnosis not present

## 2021-10-05 NOTE — Telephone Encounter (Signed)
Per MD called [facility] for records from 06/2021. Left voicemail with medical records requesting CT results, admission notes, OR notes, and cultures be faxed to office. Will refax release of information.   Hospital Main Number P:[id number] F: [phone] Juanita Laster, RMA

## 2021-10-05 NOTE — Telephone Encounter (Signed)
Faxed records release to [facility]. Received confirmation that fax was received successfully.  Wyvonne Lenz, RN

## 2021-10-05 NOTE — Progress Notes (Signed)
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   AMOXICILLIN (AMOXIL) 500 MG CAPSULE    Take 1 capsule (500 mg total) by mouth 3 (three) times daily. Started taking this on 08/11/2021.   EZETIMIBE (ZETIA) 10 MG TABLET    Take 10 mg by mouth daily.   FOSINOPRIL (MONOPRIL) 40 MG TABLET    Take 40 mg by mouth daily.   ROSUVASTATIN (CRESTOR) 20 MG TABLET    Take 20 mg by mouth daily.  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  External medical records reviewed. History from external medical records and from patient.  I do not have Operative note of her left knee surgery as well as cultures reports today( has been requested today)  Patient was admitted in the hospital [facility], [place] in November 2022 for left knee pain and swelling.  Had left knee arthrocentesis with WBC count 18,300 with 93% neutrophils.  X-ray knee with total knee arthroplasty and no acute process.  Ultrasound lower extremity negative for DVT, only popliteal fossa cyst.  Underwent left knee exchange on 11/4 and started on long-term IV antibiotics with ceftriaxone 2 g IV daily for 6 weeks starting from 11/4.  Patient has already  completed 6 weeks of IV ceftriaxone on 12/16 and is currently taking amoxicillin 500 mg twice daily. PICC line has been removed.   Accompanied by her niece today. She stays in Nov - April/may of the year in [place] with her family and the rest of the year, she stays in [place] with her friend. She denies any infection or revision surgeries for other prosthetic joints. The pain and swelling in the left knee has significantly improved after her left knee surgery and IV abtx course. She is able to walk without support. She is also working with PT. She had questions about her recent inflammatory markers being elevated.  Denies smoking, drinks 2-3 glasses of wine every week., denies IVDU.   10/05/21 Accompanied by niece. Has been taking Amoxicillin three times a day without missing doses. Denies any nausea, vomiting, diarrhea or rashes. She is working with PT and is currently switching from aqua therapy to land therapy. Has some swelling in the left knee but no pain/erythema and tenderness. She is able to walk without cane now. She is happy with her progress.   Review of Systems: ROS All systems reviewed with pertinent positives and negatives as above   Past Medical History:  Diagnosis Date   Arthritis    Bronchitis    hasn't had any in over 8 yrs   Fibromyalgia    Heart murmur    'functional heart murmur"   Hyperlipemia  Hypertension    Past Surgical History:  Procedure Laterality Date   APPENDECTOMY     BACK SURGERY     for stenosis   CHOLECYSTECTOMY  1999   JOINT REPLACEMENT Bilateral    knee   LUMBAR FUSION  2004   L4 L5   REPLACEMENT TOTAL KNEE BILATERAL Bilateral 1990, 2004   TOTAL HIP ARTHROPLASTY Left 2007   TOTAL SHOULDER ARTHROPLASTY Left 08/05/2013   Procedure: LEFT TOTAL SHOULDER ARTHROPLASTY;  Surgeon: Senaida Lange, MD;  Location: [facility] OR;  Service: Orthopedics;  Laterality: Left;   TOTAL SHOULDER ARTHROPLASTY Right 08/31/2015   Procedure: RIGHT TOTAL SHOULDER  ARTHROPLASTY;  Surgeon: Francena Hanly, MD;  Location: [facility] OR;  Service: Orthopedics;  Laterality: Right;   X-STOP IMPLANTATION  2005     Social History   Tobacco Use   Smoking status: Never  Substance Use Topics   Alcohol use: Yes    Alcohol/week: 14.0 standard drinks    Types: 14 Glasses of wine per week   Drug use: No    Family History  Problem Relation Age of Onset   Heart attack Father 68       first at age 28   Hypertension Father    Hypertension Mother    Hypertension Brother    Hyperlipidemia Brother     No Known Allergies  Health Maintenance  Topic Date Due   Hepatitis C Screening  Never done   TETANUS/TDAP  Never done   Zoster Vaccines- Shingrix (1 of 2) Never done   Pneumonia Vaccine 36+ Years old (1 - PCV) Never done   DEXA SCAN  Never done   COVID-19 Vaccine (3 - Booster for Pfizer series) 12/06/2019   INFLUENZA VACCINE  Never done   HPV VACCINES  Aged Out    Objective: BP (!) 154/79    Pulse 64    Temp 98 F (36.7 C) (Oral)    Ht 5\' 2"  (1.575 m)    Wt 164 lb 8 oz (74.6 kg)    SpO2 98%    BMI 30.09 kg/m    Physical Exam Constitutional:      Appearance: Normal appearance.  HENT:     Head: Normocephalic and atraumatic.      Mouth: Mucous membranes are moist.  Eyes:    Conjunctiva/sclera: Conjunctivae normal.     Pupils: Pupils are equal, round  Cardiovascular:     Rate and Rhythm: Normal rate and regular rhythm.     Heart sounds:   Pulmonary:     Effort: Pulmonary effort is normal.     Breath sounds: Normal breath sounds.   Abdominal:     General:     Palpations: Abdomen is soft.   Musculoskeletal:        General: Normal range of motion.   Left knee: mild swelling, no erythema, tenderness or ROM. Surgical site has healed   Skin:    General: Skin is warm and dry.     Comments:  Neurological:     General: No focal deficit present.     Mental Status: She is awake, alert and oriented to person, place, and time.   Psychiatric:         Mood and Affect: Mood normal.   Lab Results Lab Results  Component Value Date   WBC 4.5 09/21/2021   HGB 11.9 09/21/2021   HCT 38.2 09/21/2021   MCV 91.0 09/21/2021   PLT 292 09/21/2021    Lab Results  Component Value Date  CREATININE 0.84 09/21/2021   BUN 16 09/21/2021   NA 142 09/21/2021   K 3.8 09/21/2021   CL 108 09/21/2021   CO2 27 09/21/2021    Lab Results  Component Value Date   ALT 11 09/21/2021   AST 16 09/21/2021   ALKPHOS 65 08/23/2015   BILITOT 0.8 09/21/2021    No results found for: CHOL, HDL, LDLCALC, LDLDIRECT, TRIG, CHOLHDL No results found for: LABRPR, RPRTITER No results found for: HIV1RNAQUANT, HIV1RNAVL, CD4TABS  Problem List Items Addressed This Visit       Musculoskeletal and Integument   Prosthetic joint infection, subsequent encounter - Primary     Other   Medication monitoring encounter    Assessment/Plan  Left knee PJI ( Group B Streptococcus) S/p 6 weeks of IV ceftriaxone from 11/4 to 12/16 after which started on amoxicllin 500mg  PO BID Amoxicillin from 500mg  PO BID to 500mg  PO TID on 08/31/21 visit [facility] medical records from recent hospitalization in Nov, 2022 from [facility], [place] esp OR notes and cultures ( not received yet) [facility] in a month  Medication Monitoring  Pre visit labs reviewed and discussed   I have personally spent 42  minutes involved in face-to-face and non-face-to-face activities for this patient on the day of the visit, staff time, co-ordination of care and counseling of the patient.  , MD [facility] 10/05/2021, 1:58 PM

## 2021-10-12 ENCOUNTER — Other Ambulatory Visit: Payer: Self-pay

## 2021-10-12 ENCOUNTER — Ambulatory Visit (HOSPITAL_BASED_OUTPATIENT_CLINIC_OR_DEPARTMENT_OTHER): Payer: BLUE CROSS/BLUE SHIELD | Admitting: Physical Therapy

## 2021-10-12 ENCOUNTER — Encounter (HOSPITAL_BASED_OUTPATIENT_CLINIC_OR_DEPARTMENT_OTHER): Payer: Self-pay | Admitting: Physical Therapy

## 2021-10-12 ENCOUNTER — Encounter (HOSPITAL_BASED_OUTPATIENT_CLINIC_OR_DEPARTMENT_OTHER): Payer: BLUE CROSS/BLUE SHIELD | Admitting: Physical Therapy

## 2021-10-12 DIAGNOSIS — R293 Abnormal posture: Secondary | ICD-10-CM | POA: Diagnosis not present

## 2021-10-12 DIAGNOSIS — M25662 Stiffness of left knee, not elsewhere classified: Secondary | ICD-10-CM

## 2021-10-12 DIAGNOSIS — R262 Difficulty in walking, not elsewhere classified: Secondary | ICD-10-CM

## 2021-10-12 DIAGNOSIS — M6281 Muscle weakness (generalized): Secondary | ICD-10-CM

## 2021-10-12 NOTE — Therapy (Signed)
OUTPATIENT PHYSICAL THERAPY TREATMENT NOTE      Patient Name: Regina Rhodes MRN: [id number] DOB:04-Feb-1946, 76 y.o., female Today's Date: 10/12/2021  PCP: Oneita Hurt, No REFERRING PROVIDER: Alden Benjamin, MD   PT End of Session - 10/12/21 0922     Visit Number 14    Number of Visits 17    Date for PT Re-Evaluation 11/20/21    Authorization Type BCBS    PT Start Time 0925    PT Stop Time 1005    PT Time Calculation (min) 40 min    Activity Tolerance Patient tolerated treatment well    Behavior During Therapy WFL for tasks assessed/performed                 Past Medical History:  Diagnosis Date   Arthritis    Bronchitis    hasn't had any in over 8 yrs   Fibromyalgia    Heart murmur    'functional heart murmur"   Hyperlipemia    Hypertension    Past Surgical History:  Procedure Laterality Date   APPENDECTOMY     BACK SURGERY     for stenosis   CHOLECYSTECTOMY  1999   JOINT REPLACEMENT Bilateral    knee   LUMBAR FUSION  2004   L4 L5   REPLACEMENT TOTAL KNEE BILATERAL Bilateral 1990, 2004   TOTAL HIP ARTHROPLASTY Left 2007   TOTAL SHOULDER ARTHROPLASTY Left 08/05/2013   Procedure: LEFT TOTAL SHOULDER ARTHROPLASTY;  Surgeon: Senaida Lange, MD;  Location: [facility] OR;  Service: Orthopedics;  Laterality: Left;   TOTAL SHOULDER ARTHROPLASTY Right 08/31/2015   Procedure: RIGHT TOTAL SHOULDER ARTHROPLASTY;  Surgeon: Francena Hanly, MD;  Location: [facility] OR;  Service: Orthopedics;  Laterality: Right;   X-STOP IMPLANTATION  2005   Patient Active Problem List   Diagnosis Date Noted   Prosthetic joint infection, subsequent encounter 10/05/2021   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   Medication monitoring encounter 08/31/2021   Alcohol use 08/31/2021   Immunization counseling 08/31/2021   S/P shoulder replacement 08/05/2013   Preoperative clearance 07/15/2013   Hypertension    Hyperlipemia    Fibromyalgia     REFERRING DIAG: T84.54XA  (ICD-10-CM) - Infection and inflammatory reaction due to internal left knee prosthesis, initial encounter  THERAPY DIAG:  Abnormal posture  Difficulty walking  Stiffness of left knee, not elsewhere classified  Muscle weakness (generalized)  PERTINENT HISTORY: Bilat TSA, bilat TKA (20+ years)  PRECAUTIONS: None  SUBJECTIVE: Pt states that after the assessment, she had some sharp pain. She no longer has the sharp pain. She has been doing the HEP.  PAIN:  Are you having pain? No VAS scale: 0/10 Pain location: deep; laterally Pain orientation: Left  PAIN TYPE: dull, tight Pain description: intermittent  Aggravating factors: walking, standing, ADL, stairs Relieving factors: resting, extending when seated  OBJECTIVE:  PATIENT SURVEYS:  FOTO 47 57 at DC [facility]II 12pts  Foto 09/10/21 from 43 to 57.  2/8 60 at visit 13pts    TODAY'S TREATMENT:  2/17  Nu-step L5 6 min Seated HS curl  and knee ext machine set up and explanation- pt return demo Stair step flexion stretch 10s 10x (more upright trunk) Step up 4" box  fwd and lateral 10x Shuttle leg press 15x DL, 61Y staggered stance [id number] band tension Sidestepping 22ft 2x RTB at knees SLS 20s 4x  Review of new land HEP as well as previous  aquatic  2/8 Stair step flexion stretch 10s 10x STS with 2" block under L and R 2x10 each TKE blue band 2x10  Review of new land HEP as well as previous aquatic    Previous:  Pt seen for aquatic therapy today.  Treatment took place in water 3.25-4.8 ft in depth at the [facility] pool. Temp of water was 94.  Pt entered/exited the pool via stairs step through pattern independently with bilat rail.  Warm up walking fwd, bwd and side stepping multiple widths.     Seated Stretching Flutter kicking 3x30 rep Add/abd 3x25 reps Strengthening and aerobic capacity benefit  Standing Added 3 lb weights bilaterally  -step ups forward x15; backward x15 leading with L then R ea  direction Walking 2 widths after each direction -jumping bilaterally with cues for eccentric and concentric control 2x10  -skipping 3 ft 2x4 widths -Forward Lunge x10 R/L -Backward lunge x10 R/L  Cycling on noodle 5 trials of 20 quick revolutions then 20 slow. For strengthening and aerobic capacity benefit   Pt requires buoyancy for support and to offload joints with strengthening exercises. Viscosity of the water is needed for resistance of strengthening; water current perturbations provides challenge to standing balance unsupported, requiring increased core activation.       PATIENT EDUCATION:  Education details: exercise progression, DOMS expectations, muscle firing,  envelope of function, HEP, POC  Person educated: Patient Education method: Explanation, Demonstration, Tactile cues, Verbal cues Education comprehension: verbalized understanding, returned demonstration, verbal cues required, and tactile cues required     HOME EXERCISE PROGRAM: Access Code: [email or web address] URL: [email or web address] Date: 10/03/2021 Prepared by: Zebedee Iba  Exercises Sit to Stand with Arms Crossed - 1 x daily - 7 x weekly - 3 sets - 10 reps Standing Terminal Knee Extension with Resistance - 1 x daily - 7 x weekly - 3 sets - 10 reps - 2 hold Standing Knee Flexion Stretch on Step - 2 x daily - 7 x weekly - 1 sets - 10 reps - 10 hold Forward Walking - 1 x daily - 7 x weekly - 3 sets - 10 reps Standing March [facility] - 1 x daily - 7 x weekly - 3 sets - 10 reps Squat - 1 x daily - 7 x weekly - 3 sets - 10 reps Alternating Forward Lunge - 1 x daily - 7 x weekly - 3 sets - 10 reps Alternating Backward Lunge - 1 x daily - 7 x weekly - 3 sets - 10 reps Seated Straddle on Flotation Forward Breast Stroke Arms and Bicycle Legs - 1 x daily - 7 x weekly - 3 sets - 10 reps           GOALS: SHORT TERM GOALS:   STG Name Target Date Goal status  1 Pt will become independent with HEP in order  to demonstrate synthesis of PT education.   10/03/2021 1/16 INITIAL Achieved  2 Pt will be able to demonstrate reciprocal stair management (ascending and descending) in order to demonstrate functional improvement in LE function for self-care and house hold mobility.    10/03/2021 09/24/21 INITIAL Achieved  3 Pt will score at least 12 pt increase on FOTO to demonstrate functional improvement in MCII and pt perceived function.    10/03/2021 1/16 INITIAL Achieved    LONG TERM GOALS:    LTG Name Target Date Goal status  1 Pt  will become independent with final HEP in order to demonstrate synthesis of  PT education.   11/14/2021 09/24/21 INITIAL ongoing  2 Pt will decrease 5XSTS by at least 3 seconds in order to demonstrate clinically significant improvement in LE strength   11/14/2021 09/24/21 INITIAL Ongoing  3 Pt will be able to demonstrate/report ability to walk >15 mins without pain in order to demonstrate functional improvement and tolerance to exercise and community mobility.    11/14/2021 09/24/21 INITIAL ongoing  4 Pt will score >/= 57 on FOTO to demonstrate improvement in self perceived knee function.     11/14/2021 09/24/21 INITIAL Ongoing     ASSESSMENT:   CLINICAL IMPRESSION:  Pt able to continue with HEP progression and intro to gym based exercise. Pt report of pain was reduced when technique was reviewed with HEP. Pt was able to updated HEP to include more home based strengthening options. Pt without pain during session but had increased fatigue in L LE during sustained activity. Plan to revisit SL stability exercise at next session. If pt is joining a gym, plan to incorporate more machine strengthening.     Objective impairments include Abnormal gait, decreased activity tolerance, decreased balance, decreased coordination, decreased endurance, decreased knowledge of use of DME, decreased mobility, difficulty walking, decreased ROM, decreased strength, hypomobility, increased  edema, increased fascial restrictions, increased muscle spasms, impaired flexibility, improper body mechanics, postural dysfunction, obesity, and pain. These impairments are limiting patient from cleaning, community activity, driving, laundry, yard work, shopping, and exercise . Personal factors including Age, Behavior pattern, Fitness, Time since onset of injury/illness/exacerbation, and 1-2 comorbidities:    are also affecting patient's functional outcome. Patient will benefit from skilled PT to address above impairments and improve overall function.   REHAB POTENTIAL: Good   CLINICAL DECISION MAKING: Stable/uncomplicated   EVALUATION COMPLEXITY: Low  PLAN:   PT FREQUENCY: 1-2x/week   PT DURATION: 12 weeks (likely D/C by 10)   PLANNED INTERVENTIONS: Therapeutic exercises, Therapeutic activity, Neuro Muscular re-education, Balance training, Gait training, Patient/Family education, Joint mobilization, Stair training, Orthotic/Fit training, DME instructions, Aquatic Therapy, Dry Needling, Electrical stimulation, Spinal mobilization, Cryotherapy, Moist heat, scar mobilization, Taping, Vasopneumatic device, Traction, Ultrasound, Ionotophoresis 4mg /ml Dexamethasone, and Manual therapy   PLAN FOR NEXT SESSION:  progress land based exercise, knee extensor strength and L knee ROM hip Abd strength, SL balance/stability  Zebedee Iba PT, DPT 10/12/21 10:32 AM

## 2021-10-19 ENCOUNTER — Encounter: Admit: 2021-10-19 | Payer: PRIVATE HEALTH INSURANCE | Attending: Family | Primary: Internal Medicine

## 2021-10-19 MED ORDER — FOSINOPRIL 40 MG TABLET
40 mg | ORAL_TABLET | Freq: Every day | ORAL | 4 refills | Status: AC
Start: 2021-10-19 — End: ?

## 2021-10-19 MED ORDER — ROSUVASTATIN 20 MG TABLET
20 mg | ORAL_TABLET | Freq: Every day | ORAL | 4 refills | Status: AC
Start: 2021-10-19 — End: ?

## 2021-10-19 MED ORDER — AMLODIPINE 10 MG TABLET
10 mg | ORAL_TABLET | Freq: Every day | ORAL | 4 refills | Status: AC
Start: 2021-10-19 — End: ?

## 2021-10-19 MED ORDER — EZETIMIBE 10 MG TABLET
10 mg | ORAL_TABLET | Freq: Every day | ORAL | 4 refills | Status: AC
Start: 2021-10-19 — End: ?

## 2021-10-23 ENCOUNTER — Encounter (HOSPITAL_BASED_OUTPATIENT_CLINIC_OR_DEPARTMENT_OTHER): Payer: Self-pay | Admitting: Physical Therapy

## 2021-10-23 ENCOUNTER — Other Ambulatory Visit: Payer: Self-pay

## 2021-10-23 ENCOUNTER — Ambulatory Visit (HOSPITAL_BASED_OUTPATIENT_CLINIC_OR_DEPARTMENT_OTHER): Payer: BLUE CROSS/BLUE SHIELD | Admitting: Physical Therapy

## 2021-10-23 DIAGNOSIS — M6281 Muscle weakness (generalized): Secondary | ICD-10-CM

## 2021-10-23 DIAGNOSIS — R262 Difficulty in walking, not elsewhere classified: Secondary | ICD-10-CM

## 2021-10-23 DIAGNOSIS — R293 Abnormal posture: Secondary | ICD-10-CM | POA: Diagnosis not present

## 2021-10-23 DIAGNOSIS — M25662 Stiffness of left knee, not elsewhere classified: Secondary | ICD-10-CM

## 2021-10-23 NOTE — Therapy (Signed)
OUTPATIENT PHYSICAL THERAPY TREATMENT NOTE      Patient Name: Regina Rhodes MRN: [id number] DOB:Nov 30, 1945, 76 y.o., female Today's Date: 10/23/2021  PCP: Oneita Hurt, No REFERRING PROVIDER: Alden Benjamin, MD   PT End of Session - 10/23/21 0854     Visit Number 15    Number of Visits 17    Date for PT Re-Evaluation 11/20/21    Authorization Type BCBS    PT Start Time 0847    PT Stop Time 0927    PT Time Calculation (min) 40 min    Activity Tolerance Patient tolerated treatment well    Behavior During Therapy [facility] for tasks assessed/performed                  Past Medical History:  Diagnosis Date   Arthritis    Bronchitis    hasn't had any in over 8 yrs   Fibromyalgia    Heart murmur    'functional heart murmur"   Hyperlipemia    Hypertension    Past Surgical History:  Procedure Laterality Date   APPENDECTOMY     BACK SURGERY     for stenosis   CHOLECYSTECTOMY  1999   JOINT REPLACEMENT Bilateral    knee   LUMBAR FUSION  2004   L4 L5   REPLACEMENT TOTAL KNEE BILATERAL Bilateral 1990, 2004   TOTAL HIP ARTHROPLASTY Left 2007   TOTAL SHOULDER ARTHROPLASTY Left 08/05/2013   Procedure: LEFT TOTAL SHOULDER ARTHROPLASTY;  Surgeon: Senaida Lange, MD;  Location: [facility] OR;  Service: Orthopedics;  Laterality: Left;   TOTAL SHOULDER ARTHROPLASTY Right 08/31/2015   Procedure: RIGHT TOTAL SHOULDER ARTHROPLASTY;  Surgeon: Francena Hanly, MD;  Location: [facility] OR;  Service: Orthopedics;  Laterality: Right;   X-STOP IMPLANTATION  2005   Patient Active Problem List   Diagnosis Date Noted   Prosthetic joint infection, subsequent encounter 10/05/2021   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   Medication monitoring encounter 08/31/2021   Alcohol use 08/31/2021   Immunization counseling 08/31/2021   S/P shoulder replacement 08/05/2013   Preoperative clearance 07/15/2013   Hypertension    Hyperlipemia    Fibromyalgia     REFERRING DIAG: T84.54XA  (ICD-10-CM) - Infection and inflammatory reaction due to internal left knee prosthesis, initial encounter  THERAPY DIAG:  Difficulty walking  Stiffness of left knee, not elsewhere classified  Muscle weakness (generalized)  PERTINENT HISTORY: Bilat TSA, bilat TKA (20+ years)  PRECAUTIONS: None  SUBJECTIVE: Pt states that she has on and off days with the knee. She states the sidestepping band color is too easy.   PAIN:  Are you having pain? No VAS scale: 0/10 Pain location: deep; laterally Pain orientation: Left  PAIN TYPE: dull, tight Pain description: intermittent  Aggravating factors: walking, standing, ADL, stairs Relieving factors: resting, extending when seated  OBJECTIVE:  PATIENT SURVEYS:  FOTO 47 57 at DC [facility]II 12pts  Foto 09/10/21 from 43 to 57.  2/8 60 at visit 13pts    TODAY'S TREATMENT: 2/28  Nu-step L5 6 min  Stair step flexion stretch 10s 10x (more upright trunk) Step up 8" box  fwd and lateral 10x Sidestepping 100ft 2x GTB at knees (blue provided for home) SLS 20s 4x with R toe down Standing HR 20x  leg press calf raise 20x 20lbs  2/17  Nu-step L5 6 min Seated HS curl  and knee ext machine set up and explanation- pt return demo  Stair step flexion stretch 10s 10x (more upright trunk) Step up 4" box  fwd and lateral 10x Shuttle leg press 15x DL, 74Q staggered stance 595GLO band tension Sidestepping 83ft 2x RTB at knees SLS 20s 4x  Review of [place] HEP as well as previous aquatic  2/8 Stair step flexion stretch 10s 10x STS with 2" block under L and R 2x10 each TKE blue band 2x10  Review of new land HEP as well as previous aquatic    Previous:  Pt seen for aquatic therapy today.  Treatment took place in water 3.25-4.8 ft in depth at the [facility] pool. Temp of water was 94.  Pt entered/exited the pool via stairs step through pattern independently with bilat rail.  Warm up walking fwd, bwd and side stepping multiple widths.      Seated Stretching Flutter kicking 3x30 rep Add/abd 3x25 reps Strengthening and aerobic capacity benefit  Standing Added 3 lb weights bilaterally  -step ups forward x15; backward x15 leading with L then R ea direction Walking 2 widths after each direction -jumping bilaterally with cues for eccentric and concentric control 2x10  -skipping 3 ft 2x4 widths -Forward Lunge x10 R/L -Backward lunge x10 R/L  Cycling on noodle 5 trials of 20 quick revolutions then 20 slow. For strengthening and aerobic capacity benefit   Pt requires buoyancy for support and to offload joints with strengthening exercises. Viscosity of the water is needed for resistance of strengthening; water current perturbations provides challenge to standing balance unsupported, requiring increased core activation.       PATIENT EDUCATION:  Education details: exercise progression, DOMS expectations, muscle firing,  envelope of function, HEP, POC  Person educated: Patient Education method: Explanation, Demonstration, Tactile cues, Verbal cues Education comprehension: verbalized understanding, returned demonstration, verbal cues required, and tactile cues required     HOME EXERCISE PROGRAM: Access Code: [email or web address] URL: [email or web address] Date: 10/03/2021 Prepared by: Zebedee Iba  Exercises Sit to Stand with Arms Crossed - 1 x daily - 7 x weekly - 3 sets - 10 reps Standing Terminal Knee Extension with Resistance - 1 x daily - 7 x weekly - 3 sets - 10 reps - 2 hold Standing Knee Flexion Stretch on Step - 2 x daily - 7 x weekly - 1 sets - 10 reps - 10 hold Forward Walking - 1 x daily - 7 x weekly - 3 sets - 10 reps Standing March at [facility] - 1 x daily - 7 x weekly - 3 sets - 10 reps Squat - 1 x daily - 7 x weekly - 3 sets - 10 reps Alternating Forward Lunge - 1 x daily - 7 x weekly - 3 sets - 10 reps Alternating Backward Lunge - 1 x daily - 7 x weekly - 3 sets - 10 reps Seated Straddle on Flotation  Forward Breast Stroke Arms and Bicycle Legs - 1 x daily - 7 x weekly - 3 sets - 10 reps           GOALS: SHORT TERM GOALS:   STG Name Target Date Goal status  1 Pt will become independent with HEP in order to demonstrate synthesis of PT education.   10/03/2021 1/16 INITIAL Achieved  2 Pt will be able to demonstrate reciprocal stair management (ascending and descending) in order to demonstrate functional improvement in LE function for self-care and house hold mobility.    10/03/2021 09/24/21 INITIAL Achieved  3 Pt will score at least 12 pt increase on FOTO  to demonstrate functional improvement in MCII and pt perceived function.    10/03/2021 1/16 INITIAL Achieved    LONG TERM GOALS:    LTG Name Target Date Goal status  1 Pt  will become independent with final HEP in order to demonstrate synthesis of PT education.   11/14/2021 09/24/21 INITIAL ongoing  2 Pt will decrease 5XSTS by at least 3 seconds in order to demonstrate clinically significant improvement in LE strength   11/14/2021 09/24/21 INITIAL Ongoing  3 Pt will be able to demonstrate/report ability to walk >15 mins without pain in order to demonstrate functional improvement and tolerance to exercise and community mobility.    11/14/2021 09/24/21 INITIAL ongoing  4 Pt will score >/= 57 on FOTO to demonstrate improvement in self perceived knee function.     11/14/2021 09/24/21 INITIAL Ongoing     ASSESSMENT:   CLINICAL IMPRESSION:  Pt continues to be able to progress HEP as well as introduce gym based machine exercise for transition to personal training at [facility]. Pt able to progress SL CKC strength without pain at today's session. Pt's SL balance to be performed 3x day in order to progress motor learning and motor control. Pt is continuing to do well and is likely able to d/c at next session.     Objective impairments include Abnormal gait, decreased activity tolerance, decreased balance, decreased coordination, decreased  endurance, decreased knowledge of use of DME, decreased mobility, difficulty walking, decreased ROM, decreased strength, hypomobility, increased edema, increased fascial restrictions, increased muscle spasms, impaired flexibility, improper body mechanics, postural dysfunction, obesity, and pain. These impairments are limiting patient from cleaning, community activity, driving, laundry, yard work, shopping, and exercise . Personal factors including Age, Behavior pattern, Fitness, Time since onset of injury/illness/exacerbation, and 1-2 comorbidities:    are also affecting patient's functional outcome. Patient will benefit from skilled PT to address above impairments and improve overall function.   REHAB POTENTIAL: Good   CLINICAL DECISION MAKING: Stable/uncomplicated   EVALUATION COMPLEXITY: Low  PLAN:   PT FREQUENCY: 1-2x/week   PT DURATION: 12 weeks (likely D/C by 10)   PLANNED INTERVENTIONS: Therapeutic exercises, Therapeutic activity, Neuro Muscular re-education, Balance training, Gait training, Patient/Family education, Joint mobilization, Stair training, Orthotic/Fit training, DME instructions, Aquatic Therapy, Dry Needling, Electrical stimulation, Spinal mobilization, Cryotherapy, Moist heat, scar mobilization, Taping, Vasopneumatic device, Traction, Ultrasound, Ionotophoresis 4mg /ml Dexamethasone, and Manual therapy   PLAN FOR NEXT SESSION:  progress land based exercise, knee extensor strength and L knee ROM hip Abd strength, SL balance/stability  PT, DPT 10/23/21 9:32 AM

## 2021-11-06 ENCOUNTER — Ambulatory Visit (HOSPITAL_BASED_OUTPATIENT_CLINIC_OR_DEPARTMENT_OTHER): Payer: BLUE CROSS/BLUE SHIELD | Attending: Neurology | Admitting: Physical Therapy

## 2021-11-06 ENCOUNTER — Other Ambulatory Visit: Payer: Self-pay

## 2021-11-06 ENCOUNTER — Encounter (HOSPITAL_BASED_OUTPATIENT_CLINIC_OR_DEPARTMENT_OTHER): Payer: Self-pay | Admitting: Physical Therapy

## 2021-11-06 DIAGNOSIS — R293 Abnormal posture: Secondary | ICD-10-CM | POA: Insufficient documentation

## 2021-11-06 DIAGNOSIS — M6281 Muscle weakness (generalized): Secondary | ICD-10-CM | POA: Diagnosis present

## 2021-11-06 DIAGNOSIS — M25662 Stiffness of left knee, not elsewhere classified: Secondary | ICD-10-CM | POA: Insufficient documentation

## 2021-11-06 DIAGNOSIS — R262 Difficulty in walking, not elsewhere classified: Secondary | ICD-10-CM | POA: Diagnosis not present

## 2021-11-06 NOTE — Therapy (Addendum)
? ? ? ?          OUTPATIENT PHYSICAL THERAPY D/C NOTE ? ? ? ? ? ?Patient Name: Regina Rhodes ?MRN: [id number] ?DOB:[birth year], 76 y.o., female ?Today's Date: 11/06/2021 ? ?PCP: Pcp, No ?REFERRING PROVIDER: Alla German, MD ? ? PT End of Session - 11/06/21 0912   ? ? Visit Number 16   ? Number of Visits 17   ? Date for PT Re-Evaluation 11/20/21   ? Authorization Type BCBS   ? PT Start Time [phone]   ? PT Stop Time 8502   ? PT Time Calculation (min) 19 min   ? Activity Tolerance Patient tolerated treatment well   ? Behavior During Therapy [facility] for tasks assessed/performed   ? ?  ?  ? ?  ? ? ? ? ? ? ? ? ?Past Medical History:  ?Diagnosis Date  ? Arthritis   ? Bronchitis   ? hasn't had any in over 8 yrs  ? Fibromyalgia   ? Heart murmur   ? 'functional heart murmur"  ? Hyperlipemia   ? Hypertension   ? ?Past Surgical History:  ?Procedure Laterality Date  ? APPENDECTOMY    ? BACK SURGERY    ? for stenosis  ? CHOLECYSTECTOMY  1999  ? JOINT REPLACEMENT Bilateral   ? knee  ? LUMBAR FUSION  2004  ? L4 L5  ? REPLACEMENT TOTAL KNEE BILATERAL Bilateral 1990, 2004  ? TOTAL HIP ARTHROPLASTY Left 2007  ? TOTAL SHOULDER ARTHROPLASTY Left 08/05/2013  ? Procedure: LEFT TOTAL SHOULDER ARTHROPLASTY;  Surgeon: Marin Shutter, MD;  Location: [place];  Service: Orthopedics;  Laterality: Left;  ? TOTAL SHOULDER ARTHROPLASTY Right 08/31/2015  ? Procedure: RIGHT TOTAL SHOULDER ARTHROPLASTY;  Surgeon: Justice Britain, MD;  Location: [place];  Service: Orthopedics;  Laterality: Right;  ? X-STOP IMPLANTATION  2005  ? ?Patient Active Problem List  ? Diagnosis Date Noted  ? 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 Prosthetic joint infection ([place]) 08/31/2021  ? Medication monitoring encounter 08/31/2021  ? Alcohol use 08/31/2021  ? Immunization counseling 08/31/2021  ? 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 Preoperative clearance 07/15/2013  ? Hypertension   ? Hyperlipemia   ? Fibromyalgia   ? ? ?REFERRING DIAG: T84.54XA (ICD-10-CM)  - Infection and inflammatory reaction due to internal left knee prosthesis, initial encounter ? ?THERAPY DIAG:  ?Difficulty walking ? ?Stiffness of left knee, not elsewhere classified ? ?Muscle weakness (generalized) ? ?Abnormal posture ? ?PERTINENT HISTORY: Bilat TSA, bilat TKA (20+ years) ? ?PRECAUTIONS: None ? ?SUBJECTIVE: Pt states that she feels there is less strain on the knee. She states that the balance is "not where I want it just yet." She states that is progressing well.  ? ?PAIN:  ?Are you having pain? No ?VAS scale: 0/10 ?Pain location: deep; laterally ?Pain orientation: Left  ?PAIN TYPE: dull, tight ?Pain description: intermittent  ?Aggravating factors: walking, standing, ADL, stairs ?Relieving factors: resting, extending when seated ? ?OBJECTIVE: ? ?PATIENT SURVEYS:  ?FOTO 44 ?57 at DC ?MCII 12pts ? Foto 09/10/21 from 43 to 57. ? ?2/8 60 at visit 13pts  ?3/14 at  65pts at visit 16 ? ?  ?LE AROM/PROM: ?  ?A/PROM Right ?08/22/2021 Left ?08/22/2021 L 2/8 L 3/14  ?Knee flexion 100 95 105 108  ?Knee extension -4 -11 -4 0  ? (Blank rows = not tested) ?  ?LE MMT: ?  ?MMT Right ?08/22/2021 Left ?08/22/2021 R 2/8 L 2/8 L 3/14  ?Knee flexion  33.5 lbs 26.0 lbs 40.4 lbs 31.9 35.5  ?Knee extension 37.8 lbs 25.5 36.8 lbs 36.7 36.6  ? (Blank rows = not tested) ?  ?  ? ? ? ?TODAY'S TREATMENT: ? ?3/14 ? ?Review of land, gym, and aquatic HEP; progression towards gym based exercise at [facility] or [place] ? ?2/28 ? ?Nu-step L5 6 min ? ?Stair step flexion stretch 10s 10x (more upright trunk) ?Step up 8" box  fwd and lateral 10x ?Sidestepping 32f 2x GTB at knees (blue provided for home) ?SLS 20s 4x with R toe down ?Standing HR 20x  ?leg press calf raise 20x 20lbs ? ?2/17 ? ?Nu-step L5 6 min ?Seated HS curl  and knee ext machine set up and explanation- pt return demo ?Stair step flexion stretch 10s 10x (more upright trunk) ?Step up 4" box  fwd and lateral 10x ?Shuttle leg press 15x DL, 15x staggered stance 100lbs band  tension ?Sidestepping 359f2x RTB at knees ?SLS 20s 4x ? ?Review of new land HEP as well as previous aquatic ? ?2/8 ?Stair step flexion stretch 10s 10x ?STS with 2" block under L and R 2x10 each ?TKE blue band 2x10 ? ?Review of new land HEP as well as previous aquatic ? ? ? ?Previous:  ?Pt seen for aquatic therapy today.  Treatment took place in water 3.25-4.8 ft [facility] Temp of water was 94?. Marland KitchenPt entered/exited the pool via stairs step through pattern independently with bilat rail. ? ?Warm up walking fwd, bwd and side stepping multiple widths.    ? ?Seated ?Stretching ?Flutter kicking 3x30 rep ?Add/abd 3x25 reps Strengthening and aerobic capacity benefit ? ?Standing ?Added 3 lb weights bilaterally ? -step ups forward x15; backward x15 leading with L then R ea direction ?Walking 2 widths after each direction ?-jumping bilaterally with cues for eccentric and concentric control 2x10  ?-skipping 3 ft 2x4 widths ?-Forward Lunge x10 R/L ?-Backward lunge x10 R/L ? ?Cycling on noodle 5 trials of 20 quick revolutions then 20 slow. For strengthening and aerobic capacity benefit ? ? ?Pt requires buoyancy for support and to offload joints with strengthening exercises. Viscosity of the water is needed for resistance of strengthening; water current perturbations provides challenge to standing balance unsupported, requiring increased core activation. ? ? ?  ?  ?PATIENT EDUCATION:  ?Education details: exam finding, D/C planning, self exercise progression, envelope of function, HEP, POC ? ?Person educated: Patient ?Education method: Explanation, Demonstration, Tactile cues, Verbal cues ?Education comprehension: verbalized understanding, returned demonstration, verbal cues required, and tactile cues required ?  ?  ?HOME EXERCISE PROGRAM: ?Access Code: [email or web address]: [email or web address] ?Date: 10/03/2021 ?Prepared by: AlDaleen Bo ?Exercises ?Sit to Stand with Arms Crossed - 1 x daily  - 7 x weekly - 3 sets - 10 reps ?Standing Terminal Knee Extension with Resistance - 1 x daily - 7 x weekly - 3 sets - 10 reps - 2 hold ?Standing Knee Flexion Stretch on Step - 2 x daily - 7 x weekly - 1 sets - 10 reps - 10 hold ?Forward Walking - 1 x daily - 7 x weekly - 3 sets - 10 reps ?Standing March at [place] x daily - 7 x weekly - 3 sets - 10 reps ?Squat - 1 x daily - 7 x weekly - 3 sets - 10 reps ?Alternating Forward Lunge - 1 x daily - 7 x weekly - 3 sets - 10 reps ?Alternating Backward Lunge - 1 x  daily - 7 x weekly - 3 sets - 10 reps ?Seated Straddle on [employer] Breast Stroke Arms and Bicycle Legs - 1 x daily - 7 x weekly - 3 sets - 10 reps ? ? ?  ?  ?  ?  ?GOALS: ?SHORT TERM GOALS: ?  ?STG Name Target Date Goal status  ?1 Pt will become independent with HEP in order to demonstrate synthesis of PT education. ?  10/03/2021 ?1/16  ?Achieved  ?2 Pt will be able to demonstrate reciprocal stair management (ascending and descending) in order to demonstrate functional improvement in LE function for self-care and house hold mobility.  ?  10/03/2021 ?09/24/21  ?Achieved  ?3 Pt will score at least 12 pt increase on FOTO to demonstrate functional improvement in MCII and pt perceived function.  ?  10/03/2021 ?1/16  ?Achieved  ?  ?LONG TERM GOALS:  ?  ?LTG Name Target Date Goal status  ?1 Pt  will become independent with final HEP in order to demonstrate synthesis of PT education. ?  11/14/2021 ?09/24/21  ?MET  ?2 Pt will decrease 5XSTS by at least 3 seconds in order to demonstrate clinically significant improvement in LE strength ?  11/14/2021 ?09/24/21  ?MET  ?3 Pt will be able to demonstrate/report ability to walk >15 mins without pain in order to demonstrate functional improvement and tolerance to exercise and community mobility.  ?  11/14/2021 ?09/24/21  ?MET ? 20 min  ?4 Pt will score >/= 57 on FOTO to demonstrate improvement in self perceived knee function. ?  ?  11/14/2021 ?09/24/21  ?MET  ?  ? ?ASSESSMENT: ?   ?CLINICAL IMPRESSION: ? Pt demonstrates objective improvement in knee strength as well as ROM. Pt has also exceeded FOTO expected D/C score as well as met all functional goals. Pt has well rounded HEP and i

## 2021-11-07 ENCOUNTER — Ambulatory Visit (INDEPENDENT_AMBULATORY_CARE_PROVIDER_SITE_OTHER): Payer: BLUE CROSS/BLUE SHIELD | Admitting: Infectious Diseases

## 2021-11-07 ENCOUNTER — Other Ambulatory Visit: Payer: Self-pay

## 2021-11-07 ENCOUNTER — Encounter: Payer: Self-pay | Admitting: Infectious Diseases

## 2021-11-07 VITALS — BP 158/79 | HR 66 | Temp 97.2°F | Wt 167.0 lb

## 2021-11-07 DIAGNOSIS — B951 Streptococcus, group B, as the cause of diseases classified elsewhere: Secondary | ICD-10-CM

## 2021-11-07 DIAGNOSIS — T8454XD Infection and inflammatory reaction due to internal left knee prosthesis, subsequent encounter: Secondary | ICD-10-CM | POA: Diagnosis not present

## 2021-11-07 DIAGNOSIS — Z5181 Encounter for therapeutic drug level monitoring: Secondary | ICD-10-CM

## 2021-11-07 MED ORDER — AMOXICILLIN 500 MG PO CAPS: 500.0000 mg | ORAL_CAPSULE | Freq: Three times a day (TID) | ORAL | 8 refills | Status: AC

## 2021-11-07 NOTE — Progress Notes (Signed)
? ?   ? ?Patient Active Problem List  ? Diagnosis Date Noted  ? 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 Prosthetic joint infection ([place]) 08/31/2021  ? Medication monitoring encounter 08/31/2021  ? Alcohol use 08/31/2021  ? Immunization counseling 08/31/2021  ? 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 Preoperative clearance 07/15/2013  ? Hypertension   ? Hyperlipemia   ? Fibromyalgia   ? ? ?Patient's Medications  ?New Prescriptions  ? No medications on file  ?Previous Medications  ? AMLODIPINE BESYLATE PO    Take 10 mg by mouth.  ? AMOXICILLIN (AMOXIL) 500 MG CAPSULE    Take 1 capsule (500 mg total) by mouth 3 (three) times daily. Started taking this on 08/11/2021.  ? EZETIMIBE (ZETIA) 10 MG TABLET    Take 10 mg by mouth daily.  ? FOSINOPRIL (MONOPRIL) 40 MG TABLET    Take 40 mg by mouth daily.  ? ROSUVASTATIN (CRESTOR) 20 MG TABLET    Take 20 mg by mouth daily.  ?Modified Medications  ? No medications on file  ?Discontinued Medications  ? No medications on file  ? ? 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 She is taking amoxicillin 500mg  three times a day. Denies fevers, chills. Stomach upset has resolved. Has constipation and had miralax and now having BM once a day. She is going to [place] in May and may see an ID provider over there. She is accompanied by her niece. No concerns otherwise.  ? ?Review of Systems: ?ROS All systems reviewed with pertinent positives and negatives as above  ? ?Past Medical History:  ?Diagnosis Date  ? Arthritis   ? Bronchitis   ? hasn't had any in over 8 yrs  ? Fibromyalgia   ? Heart murmur   ? 'functional heart murmur"  ? Hyperlipemia   ? Hypertension   ? ?Past Surgical History:  ?Procedure Laterality Date  ?  APPENDECTOMY    ? BACK SURGERY    ? for stenosis  ? CHOLECYSTECTOMY  1999  ? JOINT REPLACEMENT Bilateral   ? knee  ? LUMBAR FUSION  2004  ? L4 L5  ? REPLACEMENT TOTAL KNEE BILATERAL Bilateral 1990, 2004  ? TOTAL HIP ARTHROPLASTY Left 2007  ? TOTAL SHOULDER ARTHROPLASTY Left 08/05/2013  ? Procedure: LEFT TOTAL SHOULDER ARTHROPLASTY;  Surgeon: Marin Shutter, MD;  Location: [place];  Service: Orthopedics;  Laterality: Left;  ? TOTAL SHOULDER ARTHROPLASTY Right 08/31/2015  ? Procedure: RIGHT TOTAL SHOULDER ARTHROPLASTY;  Surgeon: Justice Britain, MD;  Location: [place];  Service: Orthopedics;  Laterality: Right;  ? X-STOP IMPLANTATION  2005  ? ? ? ?Social History  ? ?Tobacco Use  ? Smoking status: Never  ?Substance Use Topics  ? Alcohol use: Yes  ?  Alcohol/week: 14.0 standard drinks  ?  Types: 14 Glasses of wine per week  ?  Comment: Socially  ? Drug use: No  ? ? ?Family History  ?Problem Relation Age of Onset  ? Heart attack Father 38  ?     first at age 25  ? Hypertension Father   ? Hypertension Mother   ? Hypertension Brother   ? Hyperlipidemia Brother   ? ? ?No Known Allergies ? ?Health Maintenance  ?Topic Date Due  ?  Hepatitis C Screening  Never done  ? TETANUS/TDAP  Never done  ? Zoster Vaccines- Shingrix (1 of 2) Never done  ? Pneumonia Vaccine 58+ Years old (1 - PCV) Never done  ? DEXA SCAN  Never done  ? COVID-19 Vaccine (3 - Booster for Pfizer series) 12/06/2019  ? INFLUENZA VACCINE  Never done  ? HPV VACCINES  Aged Out  ? ? ?Objective: ?BP (!) 158/79   Pulse 66   Temp (!) 97.2 ?F (36.2 ?C) (Temporal)   Wt 167 lb (75.8 kg)   BMI 30.54 kg/m?  ? ? ? ?Physical Exam ?Constitutional:   ?   Appearance: Normal appearance.  ?HENT:  ?   Head: Normocephalic and atraumatic.   ?   Mouth: Mucous membranes are moist.  ?Eyes: ?   Conjunctiva/sclera: Conjunctivae normal.  ?   Pupils: Pupils are equal, round ? ?Cardiovascular:  ?   Rate and Rhythm: Normal rate and regular rhythm.  ?   Heart sounds:  ? ?Pulmonary:  ?   Effort:  Pulmonary effort is normal.  ?   Breath sounds: Normal breath sounds.  ? ?Abdominal:  ?   General:  ?   Palpations: Abdomen is soft.  ? ?Musculoskeletal:     ?   General: Normal range of motion.  ? ?Left knee: Surgical site has healed, no swelling and tenderness.  ? ?Skin: ?   General: Skin is warm and dry.  ?   Comments: ? ?Neurological:  ?   General: No focal deficit present.  ?   Mental Status: She is awake, alert and oriented to person, place, and time.  ? ?Psychiatric:     ?   Mood and Affect: Mood normal.  ? ?Lab Results ?Lab Results  ?Component Value Date  ? WBC 4.5 09/21/2021  ? HGB 11.9 09/21/2021  ? HCT 38.2 09/21/2021  ? MCV 91.0 09/21/2021  ? PLT 292 09/21/2021  ?  ?Lab Results  ?Component Value Date  ? CREATININE 0.84 09/21/2021  ? BUN 16 09/21/2021  ? NA 142 09/21/2021  ? K 3.8 09/21/2021  ? CL 108 09/21/2021  ? CO2 27 09/21/2021  ?  ?Lab Results  ?Component Value Date  ? ALT 11 09/21/2021  ? AST 16 09/21/2021  ? ALKPHOS 65 08/23/2015  ? BILITOT 0.8 09/21/2021  ?  ?No results found for: CHOL, HDL, LDLCALC, LDLDIRECT, TRIG, CHOLHDL ?No results found for: LABRPR, RPRTITER ?No results found for: HIV1RNAQUANT, HIV1RNAVL, CD4TABS ? ?Problem List Items Addressed This Visit   ? ?  ? Musculoskeletal and Integument  ? Prosthetic joint infection, subsequent encounter  ?  ? Other  ? Medication monitoring encounter - Primary  ? Relevant Orders  ? CBC (Completed)  ? Basic metabolic panel (Completed)  ? C-reactive protein (Completed)  ? Sedimentation rate (Completed)  ? ? ? 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 ?Medication Monitoring  ?Labs today  ? ?Orders Placed This Encounter  ?Procedures   ? CBC  ? Basic metabolic panel  ?  Order Specific Question:   Has the patient fasted?  ?  Answer:   No  ? C-reactive protein  ?  Sedimentation rate  ? ? ?I have personally spent 42  minutes involved in face-to-face and non-face-to-face activities for this patient on the day of the visit, staff time, co-ordination of care and counseling of the patient. ? ?Wilber Oliphant, MD ?[facility] ?[facility] ?11/07/2021, 10:01 AM ? ?

## 2021-11-08 ENCOUNTER — Telehealth: Payer: Self-pay

## 2021-11-08 LAB — BASIC METABOLIC PANEL
BUN: 17 mg/dL (ref 7–25)
CO2: 27 mmol/L (ref 20–32)
Calcium: 9.7 mg/dL (ref 8.6–10.4)
Chloride: 104 mmol/L (ref 98–110)
Creat: 0.91 mg/dL (ref 0.60–1.00)
Glucose, Bld: 88 mg/dL (ref 65–99)
Potassium: 3.7 mmol/L (ref 3.5–5.3)
Sodium: 140 mmol/L (ref 135–146)

## 2021-11-08 LAB — CBC
HCT: 40.7 % (ref 35.0–45.0)
Hemoglobin: 12.8 g/dL (ref 11.7–15.5)
MCH: 28.6 pg (ref 27.0–33.0)
MCHC: 31.4 g/dL — ABNORMAL LOW (ref 32.0–36.0)
MCV: 90.8 fL (ref 80.0–100.0)
MPV: 10 fL (ref 7.5–12.5)
Platelets: 271 10*3/uL (ref 140–400)
RBC: 4.48 10*6/uL (ref 3.80–5.10)
RDW: 14.1 % (ref 11.0–15.0)
WBC: 5.6 10*3/uL (ref 3.8–10.8)

## 2021-11-08 LAB — SEDIMENTATION RATE: Sed Rate: 34 mm/h — ABNORMAL HIGH (ref 0–30)

## 2021-11-08 LAB — C-REACTIVE PROTEIN: CRP: 2.5 mg/L (ref <8.0)

## 2021-11-08 NOTE — Telephone Encounter (Signed)
-----   Message from Odette Fraction, MD sent at 11/08/2021 11:37 AM EDT ----- ?Regarding: FW:  ?Let her know that her labs are unremarkable. Thx.  ?----- Message ----- ?From: Interface, [facility] Results In ?Sent: 11/07/2021  10:51 PM EDT ?To: Odette Fraction, MD ? ?

## 2021-11-20 ENCOUNTER — Encounter (HOSPITAL_BASED_OUTPATIENT_CLINIC_OR_DEPARTMENT_OTHER): Payer: BLUE CROSS/BLUE SHIELD | Admitting: Physical Therapy

## 2021-12-04 ENCOUNTER — Other Ambulatory Visit: Payer: Self-pay | Admitting: Infectious Diseases

## 2021-12-17 ENCOUNTER — Other Ambulatory Visit: Payer: Self-pay

## 2021-12-17 ENCOUNTER — Other Ambulatory Visit: Payer: BLUE CROSS/BLUE SHIELD

## 2021-12-17 DIAGNOSIS — T8450XD Infection and inflammatory reaction due to unspecified internal joint prosthesis, subsequent encounter: Secondary | ICD-10-CM

## 2021-12-18 LAB — BASIC METABOLIC PANEL
BUN: 17 mg/dL (ref 7–25)
CO2: 27 mmol/L (ref 20–32)
Calcium: 9.7 mg/dL (ref 8.6–10.4)
Chloride: 106 mmol/L (ref 98–110)
Creat: 0.89 mg/dL (ref 0.60–1.00)
Glucose, Bld: 98 mg/dL (ref 65–99)
Potassium: 4 mmol/L (ref 3.5–5.3)
Sodium: 141 mmol/L (ref 135–146)

## 2021-12-18 LAB — SEDIMENTATION RATE: Sed Rate: 9 mm/h (ref 0–30)

## 2021-12-18 LAB — C-REACTIVE PROTEIN: CRP: 0.6 mg/L (ref <8.0)

## 2021-12-26 ENCOUNTER — Other Ambulatory Visit: Payer: Self-pay

## 2021-12-26 ENCOUNTER — Ambulatory Visit (INDEPENDENT_AMBULATORY_CARE_PROVIDER_SITE_OTHER): Payer: BLUE CROSS/BLUE SHIELD | Admitting: Infectious Diseases

## 2021-12-26 ENCOUNTER — Encounter: Payer: Self-pay | Admitting: Infectious Diseases

## 2021-12-26 VITALS — BP 153/79 | HR 63 | Temp 97.6°F | Wt 169.0 lb

## 2021-12-26 DIAGNOSIS — E785 Hyperlipidemia, unspecified: Secondary | ICD-10-CM | POA: Diagnosis not present

## 2021-12-26 DIAGNOSIS — T8450XD Infection and inflammatory reaction due to unspecified internal joint prosthesis, subsequent encounter: Secondary | ICD-10-CM

## 2021-12-26 DIAGNOSIS — T8454XD Infection and inflammatory reaction due to internal left knee prosthesis, subsequent encounter: Secondary | ICD-10-CM | POA: Diagnosis not present

## 2021-12-26 DIAGNOSIS — Z5181 Encounter for therapeutic drug level monitoring: Secondary | ICD-10-CM

## 2021-12-26 NOTE — Progress Notes (Addendum)
? ?   ? ?Patient Active Problem List  ? Diagnosis Date Noted  ? 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  ? Medication monitoring encounter 08/31/2021  ? Alcohol use 08/31/2021  ? Immunization counseling 08/31/2021  ? 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 Preoperative clearance 07/15/2013  ? Hypertension   ? Hyperlipemia   ? Fibromyalgia   ? ? ?Patient's Medications  ?New Prescriptions  ? No medications on file  ?Previous Medications  ? AMLODIPINE BESYLATE PO    Take 10 mg by mouth.  ? AMOXICILLIN (AMOXIL) 500 MG CAPSULE    Take 1 capsule (500 mg total) by mouth 3 (three) times daily. Started taking this on 08/11/2021.  ? EZETIMIBE (ZETIA) 10 MG TABLET    Take 10 mg by mouth daily.  ? FOSINOPRIL (MONOPRIL) 40 MG TABLET    Take 40 mg by mouth daily.  ? ROSUVASTATIN (CRESTOR) 20 MG TABLET    Take 20 mg by mouth daily.  ?Modified Medications  ? No medications on file  ?Discontinued Medications  ? No medications on file  ? ? 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 She is taking amoxicillin 500mg  three times a day. Denies fevers, chills. Denies nausea, vomiting and diarrhea. She is also taking probiotics. She is traveling to [place] in couple of weeks and will be back in [place] in Nov 2023. She will be following up with an ID provider over there. She understands she needs to take the amoxicillin for at least 6 months, early July 2023.  ? ?Review of Systems: ?ROS All systems reviewed with pertinent positives and negatives as above  ? ?Past Medical History:  ?Diagnosis Date  ? Arthritis   ? Bronchitis   ? hasn't had any in over 8 yrs  ? Fibromyalgia   ? Heart murmur   ? 'functional heart murmur"  ?  Hyperlipemia   ? Hypertension   ? ?Past Surgical History:  ?Procedure Laterality Date  ? APPENDECTOMY    ? BACK SURGERY    ? for stenosis  ? CHOLECYSTECTOMY  1999  ? JOINT REPLACEMENT Bilateral   ? knee  ? LUMBAR FUSION  2004  ? L4 L5  ? REPLACEMENT TOTAL KNEE BILATERAL Bilateral 1990, 2004  ? TOTAL HIP ARTHROPLASTY Left 2007  ? TOTAL SHOULDER ARTHROPLASTY Left 08/05/2013  ? Procedure: LEFT TOTAL SHOULDER ARTHROPLASTY;  Surgeon: Senaida LangeKevin M Supple, MD;  Location: [facility];  Service: Orthopedics;  Laterality: Left;  ? TOTAL SHOULDER ARTHROPLASTY Right 08/31/2015  ? Procedure: RIGHT TOTAL SHOULDER ARTHROPLASTY;  Surgeon: Francena HanlyKevin Supple, MD;  Location: [facility];  Service: Orthopedics;  Laterality: Right;  ? X-STOP IMPLANTATION  2005  ? ? ? ?Social History  ? ?Tobacco Use  ? Smoking status: Never  ?Substance Use Topics  ? Alcohol use: Yes  ?  Alcohol/week: 14.0 standard drinks  ?  Types: 14 Glasses of wine per week  ?  Comment: Socially  ? Drug use: No  ? ? ?Family History  ?Problem Relation Age of Onset  ? Heart attack Father [id number]  ?     first at age 77  ? Hypertension Father   ? Hypertension Mother   ? Hypertension Brother   ? Hyperlipidemia Brother   ? ? ?  No Known Allergies ? ?Health Maintenance  ?Topic Date Due  ? Hepatitis C Screening  Never done  ? TETANUS/TDAP  Never done  ? Zoster Vaccines- Shingrix (1 of 2) Never done  ? Pneumonia Vaccine 60+ Years old (1 - PCV) Never done  ? DEXA SCAN  Never done  ? COVID-19 Vaccine (3 - Booster for Pfizer series) 12/06/2019  ? INFLUENZA VACCINE  03/26/2022  ? HPV VACCINES  Aged Out  ? ? ?Objective: ?BP (!) 153/79   Pulse 63   Temp 97.6 ?F (36.4 ?C) (Oral)   Wt 169 lb (76.7 kg)   SpO2 100%   BMI 30.91 kg/m?  ? ? ?Physical Exam ?Constitutional:   ?   Appearance: Normal appearance.  ?HENT:  ?   Head: Normocephalic and atraumatic.   ?   Mouth: Mucous membranes are moist.  ?Eyes: ?   Conjunctiva/sclera: Conjunctivae normal.  ?   Pupils: Pupils are equal, round ? ?Cardiovascular:  ?   Rate  and Rhythm: Normal rate and regular rhythm.  ?   Heart sounds:  ? ?Pulmonary:  ?   Effort: Pulmonary effort is normal.  ?   Breath sounds: Normal breath sounds.  ? ?Abdominal:  ?   General:  ?   Palpations: Abdomen is soft.  ? ?Musculoskeletal:     ?   General: Normal range of motion.  ? ?Left knee: Surgical site has healed, ambulatory  ? ?Skin: ?   General: Skin is warm and dry.  ?   Comments: ? ?Neurological:  ?   General: No focal deficit present.  ?   Mental Status: She is awake, alert and oriented to person, place, and time.  ? ?Psychiatric:     ?   Mood and Affect: Mood normal.  ? ?Lab Results ?Lab Results  ?Component Value Date  ? WBC 5.6 11/07/2021  ? HGB 12.8 11/07/2021  ? HCT 40.7 11/07/2021  ? MCV 90.8 11/07/2021  ? PLT 271 11/07/2021  ?  ?Lab Results  ?Component Value Date  ? CREATININE 0.89 12/17/2021  ? BUN 17 12/17/2021  ? NA 141 12/17/2021  ? K 4.0 12/17/2021  ? CL 106 12/17/2021  ? CO2 27 12/17/2021  ?  ?Lab Results  ?Component Value Date  ? ALT 11 09/21/2021  ? AST 16 09/21/2021  ? ALKPHOS 65 08/23/2015  ? BILITOT 0.8 09/21/2021  ?  ?No results found for: CHOL, HDL, LDLCALC, LDLDIRECT, TRIG, CHOLHDL ?No results found for: LABRPR, RPRTITER ?No results found for: HIV1RNAQUANT, HIV1RNAVL, CD4TABS ? ?.prob ? 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 ?Medication Monitoring  ?12/17/21 labs discussed and WNL ? ?Hyperlipidemia  ?She has questions regarding continuation of crestor as it was stopped during her hospitalization in [place] due to elevated liver enzymes. Liver enzymes in 1/23 was WNL. She will follow up with her Cardiologist regarding continuation.  ? ?I have  personally spent 42  minutes involved in face-to-face and non-face-to-face activities for this patient on the day of the visit, staff time, co-ordination of care and counseling of the patient. ? ?[facility], MD ?[facility] ?[facility] ?12/26/2021, 1:34 PM ? ?

## 2022-02-15 ENCOUNTER — Encounter: Admit: 2022-02-15 | Payer: PRIVATE HEALTH INSURANCE | Attending: Cardiovascular Disease | Primary: Internal Medicine

## 2022-02-15 ENCOUNTER — Ambulatory Visit: Admit: 2022-02-15 | Payer: BLUE CROSS/BLUE SHIELD | Attending: Cardiovascular Disease | Primary: Internal Medicine

## 2022-02-15 DIAGNOSIS — A419 Sepsis, unspecified organism: Secondary | ICD-10-CM

## 2022-02-15 DIAGNOSIS — I10 Essential (primary) hypertension: Secondary | ICD-10-CM

## 2022-02-15 MED ORDER — AMOXICILLIN 500 MG CAPSULE
500 mg | Status: AC
Start: 2022-02-15 — End: ?

## 2022-02-15 NOTE — Progress Notes
[facility], [place] [address]UEAVW: [phone]:  [phone]: Chief Complaint:   Veronica Bryant comes in today for a follow up evaluation of HTN, HPLHistory of Present Illness:  76 year old female, former patient of Dr. Clinton Gallant.?Generally healthy. Overweight.?Has history of treated HTN, HPL (Monopril, Crestor, Zetia). Cholesterol well controlled per review of labs. LDL 50's-70's. No history of CAD.Patient reports history of murmur.Echo 2018: normal LV size/systolic function. Calcified mitral annulus, otherwise normal.?? Exercising: Water aerobics 3 x per week??She has had multiple orthopaedic surgeries. Both knees, one hip, both shoulders. No?chest?symptoms. ?Single. No children. Lives p/[place] in [place] where she has family and friends. Originally from [place].??Retired [employer] developed infection in knee replacement. Admitted to [facility], underwent surgery, c/b sepsis. Had to go to STR. Still on oral antibiotics ((over six months). Medical History: Patient's medications, allergies, problem list, past medical, surgical, social and family histories were reviewed and updated as appropriate.Medications Current Medications Medication Sig ? amLODIPine (NORVASC) 10 mg tablet Take 1 tablet (10 mg total) by mouth daily. ? amoxicillin (AMOXIL) 500 mg capsule TAKE 1 CAPSULE BY MOUTH 3 TIMES DAILY. STARTED TAKING THIS ON 08/11/2021. ? ezetimibe (ZETIA) 10 mg tablet Take 1 tablet (10 mg total) by mouth daily. ? fosinopriL (MONOPRIL) 40 mg tablet Take 1 tablet (40 mg total) by mouth daily. ? rosuvastatin (CRESTOR) 20 mg tablet Take 1 tablet (20 mg total) by mouth daily.  Review of Systems: GEN:  No fever,chills, weight lossHEENT:  No vision /hearing problems, Cardiac: as aboveVascular: no claudicationPulm: no cough , phlegm GI: no nausea, vomiting, diarrhea, constipation, melena, BRBPRGU: no hematuria, dysuriaMSK: no myalgias, no joint painsHeme: no easy bleeding/ cruising Skin: no rashNeuro: no weakness/numbness/tinglingThe remainder of the 12 point review of systems was reviewed with the patient and is negative. Objective: Vital Signs:There were no vitals filed for this visit. (  lbs.)Wt Readings from Last 3 Encounters: 06/25/21 80 kg 05/31/21 81.2 kg 06/12/20 82.4 kg Physical Exam: Gen: NAD, very pleasant HEENTN: Anicteric,no bruitsChest: Clear to ausculationCVS:  Regular  Today's ECG: NSR, WNL Assessment and Plan: [name] was seen today for a follow up evaluation.She has had a rough year. Developed infection of knee prosthesis c/b sepsis. She underwent removal of some of the hardware. She went to STR. She is finally feeling better and back to exercise but is still on abx. They are following her inflammatory markers. Will update echo to ensure no valve lesions. Diagnoses and all orders for this visit:1. Hypertension, unspecified type- EKG Tonye Royalty, MD 02/15/2022 10:35 AM

## 2022-03-26 ENCOUNTER — Inpatient Hospital Stay: Admit: 2022-03-26 | Discharge: 2022-03-26 | Payer: BLUE CROSS/BLUE SHIELD | Primary: Internal Medicine

## 2022-03-26 DIAGNOSIS — A419 Sepsis, unspecified organism: Secondary | ICD-10-CM

## 2022-03-27 ENCOUNTER — Telehealth: Admit: 2022-03-27 | Payer: PRIVATE HEALTH INSURANCE | Primary: Internal Medicine

## 2022-03-27 NOTE — Telephone Encounter
I spoke to pt and conveyed the message below re echo results. She appreciated the call.Sheppard Evens, MD  [facility] Rn Pool [address] [place]. Don't think any appreciable change when c/w Dr. Lynn Ito echo report 2018

## 2022-06-20 ENCOUNTER — Other Ambulatory Visit: Payer: Self-pay

## 2022-06-20 DIAGNOSIS — T8450XD Infection and inflammatory reaction due to unspecified internal joint prosthesis, subsequent encounter: Secondary | ICD-10-CM

## 2022-06-27 ENCOUNTER — Other Ambulatory Visit: Payer: BLUE CROSS/BLUE SHIELD

## 2022-06-27 ENCOUNTER — Other Ambulatory Visit: Payer: Self-pay

## 2022-06-27 DIAGNOSIS — T8450XD Infection and inflammatory reaction due to unspecified internal joint prosthesis, subsequent encounter: Secondary | ICD-10-CM

## 2022-06-28 LAB — COMPREHENSIVE METABOLIC PANEL
AG Ratio: 2 (calc) (ref 1.0–2.5)
ALT: 17 U/L (ref 6–29)
AST: 19 U/L (ref 10–35)
Albumin: 4.2 g/dL (ref 3.6–5.1)
Alkaline phosphatase (APISO): 58 U/L (ref 37–153)
BUN: 13 mg/dL (ref 7–25)
CO2: 26 mmol/L (ref 20–32)
Calcium: 9.2 mg/dL (ref 8.6–10.4)
Chloride: 110 mmol/L (ref 98–110)
Creat: 0.84 mg/dL (ref 0.60–1.00)
Globulin: 2.1 g/dL (calc) (ref 1.9–3.7)
Glucose, Bld: 96 mg/dL (ref 65–99)
Potassium: 4.1 mmol/L (ref 3.5–5.3)
Sodium: 143 mmol/L (ref 135–146)
Total Bilirubin: 1.4 mg/dL — ABNORMAL HIGH (ref 0.2–1.2)
Total Protein: 6.3 g/dL (ref 6.1–8.1)

## 2022-06-28 LAB — C-REACTIVE PROTEIN: CRP: 0.3 mg/L (ref <8.0)

## 2022-06-28 LAB — CBC WITH DIFFERENTIAL/PLATELET
Absolute Monocytes: 291 cells/uL (ref 200–950)
Basophils Absolute: 29 cells/uL (ref 0–200)
Basophils Relative: 0.9 %
Eosinophils Absolute: 90 cells/uL (ref 15–500)
Eosinophils Relative: 2.8 %
HCT: 41.1 % (ref 35.0–45.0)
Hemoglobin: 13.1 g/dL (ref 11.7–15.5)
Lymphs Abs: 1331 cells/uL (ref 850–3900)
MCH: 31.8 pg (ref 27.0–33.0)
MCHC: 31.9 g/dL — ABNORMAL LOW (ref 32.0–36.0)
MCV: 99.8 fL (ref 80.0–100.0)
MPV: 10.1 fL (ref 7.5–12.5)
Monocytes Relative: 9.1 %
Neutro Abs: 1459 cells/uL — ABNORMAL LOW (ref 1500–7800)
Neutrophils Relative %: 45.6 %
Platelets: 213 10*3/uL (ref 140–400)
RBC: 4.12 10*6/uL (ref 3.80–5.10)
RDW: 12.1 % (ref 11.0–15.0)
Total Lymphocyte: 41.6 %
WBC: 3.2 10*3/uL — ABNORMAL LOW (ref 3.8–10.8)

## 2022-06-28 LAB — SEDIMENTATION RATE: Sed Rate: 6 mm/h (ref 0–30)

## 2022-07-16 ENCOUNTER — Ambulatory Visit: Payer: BLUE CROSS/BLUE SHIELD | Admitting: Infectious Diseases

## 2022-07-29 ENCOUNTER — Ambulatory Visit: Payer: BLUE CROSS/BLUE SHIELD | Admitting: Infectious Diseases

## 2022-07-31 ENCOUNTER — Encounter: Payer: Self-pay | Admitting: Infectious Diseases

## 2022-07-31 ENCOUNTER — Ambulatory Visit: Payer: BLUE CROSS/BLUE SHIELD | Admitting: Infectious Diseases

## 2022-07-31 ENCOUNTER — Other Ambulatory Visit: Payer: Self-pay

## 2022-07-31 VITALS — BP 131/81 | HR 62 | Temp 97.7°F | Ht 62.0 in | Wt 175.0 lb

## 2022-07-31 DIAGNOSIS — T8454XD Infection and inflammatory reaction due to internal left knee prosthesis, subsequent encounter: Secondary | ICD-10-CM

## 2022-07-31 DIAGNOSIS — T8450XD Infection and inflammatory reaction due to unspecified internal joint prosthesis, subsequent encounter: Secondary | ICD-10-CM

## 2022-07-31 DIAGNOSIS — Z7185 Encounter for immunization safety counseling: Secondary | ICD-10-CM | POA: Diagnosis not present

## 2022-07-31 DIAGNOSIS — Z5181 Encounter for therapeutic drug level monitoring: Secondary | ICD-10-CM | POA: Diagnosis not present

## 2022-07-31 NOTE — Progress Notes (Signed)
Patient Active Problem List   Diagnosis Date Noted   Prosthetic joint infection, subsequent encounter 10/05/2021   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   Medication monitoring encounter 08/31/2021   Alcohol use 08/31/2021   Immunization counseling 08/31/2021   S/P shoulder replacement 08/05/2013   Preoperative clearance 07/15/2013   Hypertension    Hyperlipemia    Fibromyalgia     Patient's Medications  New Prescriptions   No medications on file  Previous Medications   AMLODIPINE BESYLATE PO    Take 10 mg by mouth.   AMOXICILLIN (AMOXIL) 500 MG CAPSULE    Take 1 capsule (500 mg total) by mouth 3 (three) times daily. Started taking this on 08/11/2021.   EZETIMIBE (ZETIA) 10 MG TABLET    Take 10 mg by mouth daily.   FOSINOPRIL (MONOPRIL) 40 MG TABLET    Take 40 mg by mouth daily.   ROSUVASTATIN (CRESTOR) 20 MG TABLET    Take 20 mg by mouth daily.  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 She is taking amoxicillin 500mg  three times a day. Denies fevers, chills. Denies nausea, vomiting and diarrhea. She is also taking probiotics. She is traveling to [place] in couple of weeks and will be back in in Nov 2023. She will be following up with an ID provider over there. She understands she needs to take the amoxicillin for at least 6 months, early July 2023.   07/31/22 Here for follow up for left PJI. Taking amoxicillin as instructed. Denies missing doses or any concerns with the antibiotics. She came back to [facility] from [place] 3 weeks to celebrate Thanksgiving as well Christmas with her family. No concerns with her left  knee whatsoever. We discussed to discontinue abtx as she has completed more than 6 months of PO abtx suppression by now with nornmalization of her inflammatory markers. I discussed with her about signs and symptoms of recurrence and seek immediate attention at that time, She verbalized understanding.   Review of Systems: ROS All systems reviewed with pertinent positives and negatives as above   Past Medical History:  Diagnosis Date   Arthritis    Bronchitis    hasn't had any in over 8 yrs   Fibromyalgia    Heart murmur    'functional heart murmur"   Hyperlipemia    Hypertension    Past Surgical History:  Procedure Laterality Date   APPENDECTOMY     BACK SURGERY     for stenosis   CHOLECYSTECTOMY  1999   JOINT REPLACEMENT Bilateral    knee   LUMBAR FUSION  2004   L4 L5   REPLACEMENT TOTAL KNEE BILATERAL Bilateral 1990, 2004   TOTAL HIP ARTHROPLASTY Left 2007   TOTAL SHOULDER ARTHROPLASTY Left 08/05/2013   Procedure: LEFT TOTAL SHOULDER ARTHROPLASTY;  Surgeon: 14/06/2013, MD;  Location: [facility] OR;  Service: Orthopedics;  Laterality: Left;   TOTAL SHOULDER ARTHROPLASTY Right 08/31/2015   Procedure: RIGHT TOTAL SHOULDER ARTHROPLASTY;  Surgeon: 10/29/2015, MD;  Location: [facility] OR;  Service: Orthopedics;  Laterality: Right;   X-STOP IMPLANTATION  2005     Social  History   Tobacco Use   Smoking status: Never  Substance Use Topics   Alcohol use: Yes    Alcohol/week: 14.0 standard drinks of alcohol    Types: 14 Glasses of wine per week    Comment: Socially   Drug use: No    Family History  Problem Relation Age of Onset   Heart attack Father 68       first at age 49   Hypertension Father    Hypertension Mother    Hypertension Brother    Hyperlipidemia Brother     No Known Allergies  Health Maintenance  Topic Date Due   Hepatitis C Screening  Never done   DTaP/Tdap/Td (1 - Tdap) Never done   Zoster Vaccines- Shingrix (1 of 2) Never done   Pneumonia Vaccine 65+ Years  old (1 - PCV) Never done   DEXA SCAN  Never done   INFLUENZA VACCINE  Never done   COVID-19 Vaccine (3 - 2023-24 season) 04/26/2022   HPV VACCINES  Aged Out    Objective: BP 131/81   Pulse 62   Temp 97.7 F (36.5 C) (Oral)   Ht 5\' 2"  (1.575 m)   Wt 175 lb (79.4 kg)   SpO2 100%   BMI 32.01 kg/m     Physical Exam Constitutional:      Appearance: Normal appearance.  HENT:     Head: Normocephalic and atraumatic.      Mouth: Mucous membranes are moist.  Eyes:    Conjunctiva/sclera: Conjunctivae normal.     Pupils: Pupils are equal, round  Cardiovascular:     Rate and Rhythm: Normal rate and regular rhythm.     Heart sounds:   Pulmonary:     Effort: Pulmonary effort is normal.     Breath sounds: Normal breath sounds.   Abdominal:     General:     Palpations: Abdomen is soft.   Musculoskeletal:        General: Normal range of motion.   Left knee: Surgical site has healed, ambulatory, no signs of infection  Skin:    General: Skin is warm and dry.     Comments:  Neurological:     General: No focal deficit present.     Mental Status: She is awake, alert and oriented to person, place, and time.   Psychiatric:        Mood and Affect: Mood normal.   Lab Results Lab Results  Component Value Date   WBC 3.2 (L) 06/27/2022   HGB 13.1 06/27/2022   HCT 41.1 06/27/2022   MCV 99.8 06/27/2022   PLT 213 06/27/2022    Lab Results  Component Value Date   CREATININE 0.84 06/27/2022   BUN 13 06/27/2022   NA 143 06/27/2022   K 4.1 06/27/2022   CL 110 06/27/2022   CO2 26 06/27/2022    Lab Results  Component Value Date   ALT 17 06/27/2022   AST 19 06/27/2022   ALKPHOS 65 08/23/2015   BILITOT 1.4 (H) 06/27/2022    No results found for: "CHOL", "HDL", "LDLCALC", "LDLDIRECT", "TRIG", "CHOLHDL" No results found for: "LABRPR", "RPRTITER" No results found for: "HIV1RNAQUANT", "HIV1RNAVL", "CD4TABS"  .prob  Assessment/Plan  Left knee PJI ( Group B  Streptococcus) S/p 6 weeks of IV ceftriaxone from 11/4 to 12/16 after which started on amoxicllin 500mg  PO BID Amoxicillin from 500mg  PO BID to 500mg  PO TID on 08/31/21 visit which she continues until today  DC amoxicillin today  Fu as  needed   Medication Monitoring  Discussed labs from 11/2 with normal inflammatory markers   Immunization counseling  Has received her Flu, RSV as well as COVID booster per her report   I have personally spent 42  minutes involved in face-to-face and non-face-to-face activities for this patient on the day of the visit, staff time, co-ordination of care and counseling of the patient.  Victoriano Lain, MD [facility] 07/31/2022, 7:52 AM

## 2022-11-01 ENCOUNTER — Telehealth: Admit: 2022-11-01 | Payer: PRIVATE HEALTH INSURANCE | Attending: Cardiovascular Disease | Primary: Internal Medicine

## 2022-11-07 ENCOUNTER — Encounter: Admit: 2022-11-07 | Payer: PRIVATE HEALTH INSURANCE | Attending: Family | Primary: Internal Medicine

## 2022-11-07 MED ORDER — ROSUVASTATIN 20 MG TABLET
20 mg | ORAL_TABLET | ORAL | 4 refills | Status: AC
Start: 2022-11-07 — End: ?

## 2022-11-07 MED ORDER — AMLODIPINE 10 MG TABLET
10 mg | ORAL_TABLET | ORAL | 4 refills | Status: AC
Start: 2022-11-07 — End: ?

## 2022-11-07 MED ORDER — FOSINOPRIL 40 MG TABLET
40 mg | ORAL_TABLET | ORAL | 4 refills | Status: AC
Start: 2022-11-07 — End: ?

## 2022-11-08 ENCOUNTER — Encounter: Admit: 2022-11-08 | Payer: PRIVATE HEALTH INSURANCE | Attending: Family | Primary: Internal Medicine

## 2022-11-08 MED ORDER — EZETIMIBE 10 MG TABLET
10 mg | ORAL_TABLET | ORAL | 2 refills | Status: AC
Start: 2022-11-08 — End: ?

## 2023-04-29 ENCOUNTER — Ambulatory Visit: Admit: 2023-04-29 | Payer: BLUE CROSS/BLUE SHIELD | Attending: Cardiovascular Disease | Primary: Internal Medicine

## 2023-04-29 DIAGNOSIS — Z0000 Encounter for general adult medical examination without abnormal findings: Secondary | ICD-10-CM

## 2023-04-29 NOTE — Progress Notes
[facility], [place] [address]EXBMW: [phone]:  [phone]: Chief Complaint:   [name] comes in today for a follow up evaluation of HTNHistory of Present Illness: 77 year old female, former patient of Dr. Clinton Gallant.  Generally healthy. Overweight. Has history of treated HTN, HPL (Monopril, Crestor, Zetia). Cholesterol well controlled per review of labs. LDL 50's-70's. No history of CAD.Patient reports history of murmur.[id number]:  mildly calcified aortic valve. Mild AI. Normal EF  Exercising: Water aerobics 3 x per week  She has had multiple orthopaedic surgeries. Both knees, one hip, both shoulders. No chest symptoms.  Single. No children. Lives p/t in [place] where she has family and friends. Originally from [place].  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 Admitted to [facility], underwent surgery, c/b sepsis. Had to go to STR. Prolonged course of antibiotics.  Medical History: Patient's medications, allergies, problem list, past medical, surgical, social and family histories were reviewed and updated as appropriate.Medications Current Medications Medication Sig  amLODIPine (NORVASC) 10 mg tablet take 1 tablet by mouth every day  ezetimibe (ZETIA) 10 mg tablet take 1 tablet by mouth every day  fosinopriL (MONOPRIL) 40 mg tablet take 1 tablet by mouth every day  rosuvastatin (CRESTOR) 20 mg tablet take 1 tablet by mouth every day  Review of Systems: GEN:  No fever,chills, weight lossHEENT:  No vision /hearing problems, Cardiac: as aboveVascular: no claudicationPulm: no cough , phlegm GI: no nausea, vomiting, diarrhea, constipation, melena, BRBPRGU: no hematuria, dysuriaMSK: no myalgias, no joint painsHeme: no easy bleeding/ cruising Skin: no rashNeuro: no weakness/numbness/tinglingThe remainder of the 12 point review of systems was reviewed with the patient and is negative. Objective: Vital Signs:Vitals:  04/29/23 1334 BP: 131/79 Pulse: (!) 55  (172 lbs.)Wt Readings from Last 3 Encounters: 04/29/23 78 kg 02/15/22 75.8 kg 06/25/21 80 kg Physical Exam: Gen: NAD, very pleasant HEENTN: Anicteric,no bruitsChest: Clear to ausculationCVS:  Regular   Today's ECG: NSR Assessment and Plan: Veronica Bryant was seen today for a follow up evaluation.BP well controlled. No chest symptoms. On good medical therapy. Remains independent, active. Family wants her to move to [facility] which she is slowly planning. Doesn't want to lose her independence. Annual f/u. Will update echo in a year or so,Jaramiah Bossard C Oz Gammel, MD 04/29/2023 1:48 PM

## 2023-05-09 ENCOUNTER — Encounter: Admit: 2023-05-09 | Payer: PRIVATE HEALTH INSURANCE | Attending: Cardiovascular Disease | Primary: Internal Medicine

## 2023-05-15 ENCOUNTER — Encounter: Admit: 2023-05-15 | Payer: PRIVATE HEALTH INSURANCE | Attending: Cardiovascular Disease | Primary: Internal Medicine

## 2023-05-15 MED ORDER — EZETIMIBE 10 MG TABLET
10 | ORAL_TABLET | ORAL | 4 refills | Status: AC
Start: 2023-05-15 — End: ?

## 2023-05-15 NOTE — Telephone Encounter
 Send to [id number] - [place], [place] - [address] Pt needs refill for  ezetimibe (ZETIA) 10 mg tablet

## 2023-05-16 ENCOUNTER — Other Ambulatory Visit: Payer: Self-pay | Admitting: Infectious Diseases

## 2023-07-15 ENCOUNTER — Encounter: Admit: 2023-07-15 | Payer: BLUE CROSS/BLUE SHIELD | Attending: Cardiovascular Disease | Primary: Internal Medicine

## 2023-11-06 ENCOUNTER — Encounter: Admit: 2023-11-06 | Payer: PRIVATE HEALTH INSURANCE | Attending: Internal Medicine | Primary: Internal Medicine

## 2023-11-06 MED ORDER — AMLODIPINE 10 MG TABLET
10 | ORAL_TABLET | ORAL | 3 refills | Status: AC
Start: 2023-11-06 — End: ?

## 2023-11-06 MED ORDER — FOSINOPRIL 40 MG TABLET
40 | ORAL_TABLET | ORAL | 3 refills | Status: AC
Start: 2023-11-06 — End: ?

## 2023-11-06 MED ORDER — ROSUVASTATIN 20 MG TABLET
20 | ORAL_TABLET | ORAL | 3 refills | Status: AC
Start: 2023-11-06 — End: ?

## 2024-03-09 ENCOUNTER — Encounter: Admit: 2024-03-09 | Payer: PRIVATE HEALTH INSURANCE | Attending: Cardiovascular Disease | Primary: Internal Medicine

## 2024-03-09 VITALS — BP 141/83 | HR 56 | Ht 62.0 in | Wt 172.0 lb

## 2024-03-09 DIAGNOSIS — I351 Nonrheumatic aortic (valve) insufficiency: Secondary | ICD-10-CM

## 2024-03-09 DIAGNOSIS — Z0000 Encounter for general adult medical examination without abnormal findings: Principal | ICD-10-CM

## 2024-03-09 NOTE — Progress Notes
 [facility], [place] [address]Eynwz: [phone]:  [phone]: Chief Complaint:   Ms. Emogene Muratalla Legault comes in today for a follow up evaluationHistory of Present Illness:  Generally healthy. Overweight. Has history of treated HTN, HPL (Monopril , Crestor , Zetia ). Cholesterol well controlled per review of labs. LDL 50's-70's. No history of CAD.Patient reports history of murmur. [id number]:  mildly calcified aortic valve. Mild AI. Normal EF  Exercising: Water aerobics 3 x per week  She has had multiple orthopaedic surgeries. Both knees, one hip, both shoulders. No chest symptoms.  Single. No children. Lives p/t in [place] there where she has family and friends. Originally from [place].  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 Admitted to [facility], underwent surgery, c/b sepsis. Had to go to STR. Prolonged course of antibiotics.  2025 back pain. On gaba and meloxicamMedical History: Patient's medications, allergies, problem list, past medical, surgical, social and family histories were reviewed and updated as appropriate.Medications No outpatient medications have been marked as taking for the 03/09/24 encounter (Appointment) with Mignon Ip, MD.  Review of Systems: GEN:  No fever,chills, weight lossHEENT:  No vision /hearing problems, Cardiac: as aboveVascular: no claudicationPulm: no cough , phlegm GI: no nausea, vomiting, diarrhea, constipation, melena, BRBPRGU: no hematuria, dysuriaMSK: no myalgias, no joint painsHeme: no easy bleeding/ cruising Skin: no rashNeuro: no weakness/numbness/tinglingThe remainder of the 12 point review of systems was reviewed with the patient and is negative. Objective: Vital Signs:There were no vitals filed for this visit. (  lbs.)Wt Readings from Last 3 Encounters: 04/29/23 78 kg 02/15/22 75.8 kg 06/25/21 80 kg Physical Exam: Gen: NAD, very pleasant HEENTN: Anicteric,no bruitsChest: Clear to ausculationCVS: Regular + murmur  Today's ECG: NSR Normal Assessment and Plan: Ron A Brahmbhatt was seen today for a follow up evaluation.Biggest issue now is lower back pain. Will get epidural. Has really limited her activity.No chest symptoms. ECG normal. On good medical therapy. Remains independent, Family wants her to move to [facility] which she is slowly planning. Doesn't want to lose her independence. Annual f/u. Will update echo for mild AI.  Calcified aortic valve. Ip JAYSON Mignon, MD 03/08/2024 9:13 PM

## 2024-04-30 ENCOUNTER — Other Ambulatory Visit: Admit: 2024-04-30 | Payer: PRIVATE HEALTH INSURANCE | Attending: Family Medicine | Primary: Internal Medicine

## 2024-04-30 DIAGNOSIS — E669 Obesity, unspecified: Secondary | ICD-10-CM

## 2024-04-30 DIAGNOSIS — R7401 Elevation of levels of liver transaminase levels: Secondary | ICD-10-CM

## 2024-04-30 DIAGNOSIS — M546 Pain in thoracic spine: Principal | ICD-10-CM

## 2024-04-30 DIAGNOSIS — I10 Essential (primary) hypertension: Secondary | ICD-10-CM

## 2024-04-30 LAB — CBC WITH AUTO DIFFERENTIAL
BKR WAM ABSOLUTE IMMATURE GRANULOCYTES.: 0 x 1000/ÂµL (ref 0.00–0.30)
BKR WAM ABSOLUTE LYMPHOCYTE COUNT.: 1.08 x 1000/ÂµL (ref 0.60–3.70)
BKR WAM ABSOLUTE NRBC: 0 x 1000/ÂµL (ref 0.00–1.00)
BKR WAM ANC (ABSOLUTE NEUTROPHIL COUNT): 2.06 x 1000/ÂµL (ref 2.00–7.60)
BKR WAM BASOPHIL ABSOLUTE COUNT.: 0.02 x 1000/ÂµL (ref 0.00–1.00)
BKR WAM BASOPHILS: 0.5 % (ref 0.0–1.4)
BKR WAM EOSINOPHIL ABSOLUTE COUNT.: 0.17 x 1000/ÂµL (ref 0.00–1.00)
BKR WAM EOSINOPHILS: 4.6 % (ref 0.0–5.0)
BKR WAM HEMATOCRIT: 42.8 % (ref 35.00–45.00)
BKR WAM HEMOGLOBIN: 13.4 g/dL (ref 11.7–15.5)
BKR WAM IMMATURE GRANULOCYTES: 0 % (ref 0.0–1.0)
BKR WAM LYMPHOCYTES: 29.2 % (ref 17.0–50.0)
BKR WAM MCH: 31.7 pg (ref 27.0–33.0)
BKR WAM MCHC: 31.3 g/dL (ref 31.0–36.0)
BKR WAM MCV: 101.2 fL — ABNORMAL HIGH (ref 80.0–100.0)
BKR WAM MONOCYTE ABSOLUTE COUNT.: 0.37 x 1000/ÂµL (ref 0.00–1.00)
BKR WAM MONOCYTES: 10 % (ref 4.0–12.0)
BKR WAM MPV: 10.3 fL (ref 8.0–12.0)
BKR WAM NEUTROPHILS: 55.7 % (ref 39.0–72.0)
BKR WAM NUCLEATED RED BLOOD CELLS: 0 % (ref 0.0–1.0)
BKR WAM PLATELETS: 208 x1000/ÂµL (ref 150–420)
BKR WAM RDW-CV: 12.1 % (ref 11.0–15.0)
BKR WAM RED BLOOD CELL COUNT.: 4.23 M/ÂµL (ref 4.00–6.00)
BKR WAM WHITE BLOOD CELL COUNT: 3.7 x1000/ÂµL — ABNORMAL LOW (ref 4.0–11.0)

## 2024-04-30 LAB — BASIC METABOLIC PANEL
BKR ANION GAP: 4 (ref 3–18)
BKR BLOOD UREA NITROGEN: 10 mg/dL (ref 8–25)
BKR BUN / CREAT RATIO: 13.9 (ref 8.0–25.0)
BKR CALCIUM: 9.2 mg/dL (ref 8.4–10.3)
BKR CHLORIDE: 112 mmol/L (ref 95–115)
BKR CO2: 26 mmol/L (ref 21–32)
BKR CREATININE: 0.72 mg/dL (ref 0.50–1.30)
BKR EGFR, CREATININE (CKD-EPI 2021): >60 mL/min/1.73m2 (ref >=60)
BKR GLUCOSE: 106 mg/dL — ABNORMAL HIGH (ref 70–100)
BKR OSMOLALITY CALCULATION: 283 mosm/kg (ref 275–295)
BKR POTASSIUM: 4 mmol/L (ref 3.5–5.1)
BKR SODIUM: 142 mmol/L (ref 136–145)

## 2024-05-01 ENCOUNTER — Encounter: Admit: 2024-05-01 | Payer: PRIVATE HEALTH INSURANCE | Attending: Internal Medicine | Primary: Internal Medicine

## 2024-05-10 ENCOUNTER — Encounter: Admit: 2024-05-10 | Payer: PRIVATE HEALTH INSURANCE | Attending: Cardiovascular Disease | Primary: Internal Medicine

## 2024-05-13 ENCOUNTER — Encounter: Admit: 2024-05-13 | Payer: PRIVATE HEALTH INSURANCE | Attending: Internal Medicine | Primary: Internal Medicine

## 2024-05-17 MED ORDER — EZETIMIBE 10 MG TABLET
10 | ORAL_TABLET | ORAL | 4 refills | 90.00000 days | Status: AC
Start: 2024-05-17 — End: ?

## 2024-05-18 ENCOUNTER — Encounter: Admit: 2024-05-18 | Payer: PRIVATE HEALTH INSURANCE | Primary: Internal Medicine

## 2024-05-18 DIAGNOSIS — M5412 Radiculopathy, cervical region: Principal | ICD-10-CM

## 2024-05-24 ENCOUNTER — Encounter: Admit: 2024-05-24 | Payer: PRIVATE HEALTH INSURANCE | Attending: Internal Medicine | Primary: Internal Medicine

## 2024-05-25 ENCOUNTER — Ambulatory Visit: Admit: 2024-05-25 | Payer: PRIVATE HEALTH INSURANCE | Primary: Internal Medicine

## 2024-06-07 ENCOUNTER — Inpatient Hospital Stay: Admit: 2024-06-07 | Discharge: 2024-06-07 | Payer: PRIVATE HEALTH INSURANCE | Primary: Internal Medicine

## 2024-06-07 DIAGNOSIS — M5412 Radiculopathy, cervical region: Principal | ICD-10-CM

## 2024-07-27 ENCOUNTER — Encounter: Admit: 2024-07-27 | Payer: PRIVATE HEALTH INSURANCE | Attending: Cardiovascular Disease | Primary: Internal Medicine

## 2024-08-10 ENCOUNTER — Encounter: Admit: 2024-08-10 | Payer: PRIVATE HEALTH INSURANCE | Attending: Cardiovascular Disease | Primary: Internal Medicine

## 2024-08-10 VITALS — BP 134/74 | HR 53 | Ht 62.0 in | Wt 168.0 lb

## 2024-08-10 DIAGNOSIS — I10 Essential (primary) hypertension: Principal | ICD-10-CM

## 2024-08-10 MED ORDER — ATORVASTATIN 40 MG TABLET
40 | Freq: Every day | ORAL | 4.00 refills | 90.00000 days | Status: AC
Start: 2024-08-10 — End: ?

## 2024-08-10 MED ORDER — PREGABALIN 50 MG CAPSULE
50 | ORAL | 2.00 refills | 14.00000 days | Status: AC
Start: 2024-08-10 — End: ?

## 2024-08-10 NOTE — Progress Notes [1]
 [facility], [place] [address]Eynwz: [phone]:  [phone]: Chief Complaint:   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 Ad[facility] to [facility] underwent surgery c/b sepsis, prolonged course of Abx, STR etc. 2025: back pain. Rough year. Getting injections. Then had a fall. Using cane for balance. Originally from [place]. Lives p/t in [place] where she has family and friends. Medical History: Patient's medications, allergies, problem list, past medical, surgical, social and family histories were reviewed and updated as appropriate.Medications No outpatient medications have been marked as taking for the 08/10/24 encounter (Appointment) with Mignon Ip, MD.  Review of Systems: GEN:  No fever,chills, weight lossHEENT:  No vision /hearing problems, Cardiac: as aboveVascular: no claudicationPulm: no cough , phlegm GI: no nausea, vomiting, diarrhea, constipation, melena, BRBPRGU: no hematuria, dysuriaMSK: no myalgias, no joint painsHeme: no easy bleeding/ cruising Skin: no rashNeuro: no weakness/numbness/tinglingThe remainder of the 12 point review of systems was reviewed with the patient and is negative. Objective: Vital Signs:There were no vitals filed for this visit. (  lbs.)Wt Readings from Last 3 Encounters: 03/09/24 78 kg 04/29/23 78 kg 02/15/22 75.8 kg Physical Exam: Gen: NAD, very pleasant HEENTN: Anicteric,no bruitsChest: Clear to ausculationCVS:Regular   Today's ECG: SB, WNL Assessment and Plan: Veronica Bryant was seen today for a follow up evaluation.Moving full time to [place] . ECG normalBP normal. Elevated LFTs on rosuvastatin . Now on atorvastatin . Should continue to monitor. Still doing water aerobics. Ip JAYSON Mignon, MD 08/09/2024 2:05 PM

## 2024-09-14 ENCOUNTER — Encounter: Admit: 2024-09-14 | Payer: PRIVATE HEALTH INSURANCE | Attending: Cardiovascular Disease | Primary: Internal Medicine

## 2024-09-14 MED ORDER — FOSINOPRIL 40 MG TABLET
40 | ORAL_TABLET | ORAL | 3 refills | Status: AC
Start: 2024-09-14 — End: ?

## 2024-09-17 ENCOUNTER — Encounter: Admit: 2024-09-17 | Payer: PRIVATE HEALTH INSURANCE | Attending: Cardiovascular Disease | Primary: Internal Medicine

## 2024-09-17 MED ORDER — AMLODIPINE 10 MG TABLET
10 | ORAL_TABLET | ORAL | 3 refills | 30.00000 days | Status: AC
Start: 2024-09-17 — End: ?
